# Patient Record
Sex: Female | Born: 1945
Health system: Southern US, Community
[De-identification: ages and names within clinical notes are randomized; demographics above are authoritative.]

## PROBLEM LIST (undated history)

## (undated) DIAGNOSIS — E7800 Pure hypercholesterolemia, unspecified: Secondary | ICD-10-CM

## (undated) DIAGNOSIS — E119 Type 2 diabetes mellitus without complications: Secondary | ICD-10-CM

## (undated) DIAGNOSIS — N200 Calculus of kidney: Secondary | ICD-10-CM

## (undated) DIAGNOSIS — I10 Essential (primary) hypertension: Secondary | ICD-10-CM

## (undated) HISTORY — DX: Pure hypercholesterolemia, unspecified: E78.00

## (undated) HISTORY — DX: Essential (primary) hypertension: I10

## (undated) HISTORY — DX: Calculus of kidney: N20.0

## (undated) HISTORY — DX: Type 2 diabetes mellitus without complications: E11.9

## (undated) HISTORY — PX: TUBAL LIGATION: SHX77

## (undated) HISTORY — PX: BACK SURGERY: SHX140

## (undated) HISTORY — PX: NASAL SINUS SURGERY: SHX719

---

## 1999-08-15 ENCOUNTER — Encounter: Admission: RE | Admit: 1999-08-15 | Discharge: 1999-08-15 | Payer: Self-pay | Admitting: Obstetrics and Gynecology

## 1999-08-15 ENCOUNTER — Encounter: Payer: Self-pay | Admitting: Obstetrics and Gynecology

## 2007-03-27 ENCOUNTER — Encounter: Payer: Self-pay | Admitting: Internal Medicine

## 2007-06-17 ENCOUNTER — Encounter: Payer: Self-pay | Admitting: Internal Medicine

## 2007-06-17 ENCOUNTER — Ambulatory Visit: Payer: Self-pay | Admitting: Internal Medicine

## 2007-06-17 DIAGNOSIS — H538 Other visual disturbances: Secondary | ICD-10-CM | POA: Insufficient documentation

## 2007-06-17 DIAGNOSIS — M542 Cervicalgia: Secondary | ICD-10-CM | POA: Insufficient documentation

## 2007-06-17 DIAGNOSIS — K219 Gastro-esophageal reflux disease without esophagitis: Secondary | ICD-10-CM | POA: Insufficient documentation

## 2007-06-17 DIAGNOSIS — R1013 Epigastric pain: Secondary | ICD-10-CM

## 2007-06-17 DIAGNOSIS — M25519 Pain in unspecified shoulder: Secondary | ICD-10-CM | POA: Insufficient documentation

## 2007-06-17 DIAGNOSIS — K3189 Other diseases of stomach and duodenum: Secondary | ICD-10-CM | POA: Insufficient documentation

## 2007-06-17 DIAGNOSIS — E785 Hyperlipidemia, unspecified: Secondary | ICD-10-CM | POA: Insufficient documentation

## 2007-06-17 DIAGNOSIS — M961 Postlaminectomy syndrome, not elsewhere classified: Secondary | ICD-10-CM | POA: Insufficient documentation

## 2007-06-17 DIAGNOSIS — R071 Chest pain on breathing: Secondary | ICD-10-CM | POA: Insufficient documentation

## 2007-06-17 DIAGNOSIS — I10 Essential (primary) hypertension: Secondary | ICD-10-CM | POA: Insufficient documentation

## 2007-06-17 DIAGNOSIS — IMO0002 Reserved for concepts with insufficient information to code with codable children: Secondary | ICD-10-CM | POA: Insufficient documentation

## 2007-06-17 LAB — CONVERTED CEMR LAB
ALT: 34 units/L (ref 0–35)
Alkaline Phosphatase: 69 units/L (ref 39–117)
CO2: 25 meq/L (ref 19–32)
Creatinine, Ser: 0.74 mg/dL (ref 0.40–1.20)
Sodium: 142 meq/L (ref 135–145)
TSH: 2.368 microintl units/mL (ref 0.350–5.50)
Total Bilirubin: 0.4 mg/dL (ref 0.3–1.2)
Total Protein: 7.8 g/dL (ref 6.0–8.3)

## 2007-06-19 DIAGNOSIS — R0989 Other specified symptoms and signs involving the circulatory and respiratory systems: Secondary | ICD-10-CM | POA: Insufficient documentation

## 2007-06-19 DIAGNOSIS — I428 Other cardiomyopathies: Secondary | ICD-10-CM | POA: Insufficient documentation

## 2007-06-19 DIAGNOSIS — M899 Disorder of bone, unspecified: Secondary | ICD-10-CM | POA: Insufficient documentation

## 2007-06-19 DIAGNOSIS — M949 Disorder of cartilage, unspecified: Secondary | ICD-10-CM

## 2007-06-19 DIAGNOSIS — R0609 Other forms of dyspnea: Secondary | ICD-10-CM | POA: Insufficient documentation

## 2007-10-21 ENCOUNTER — Telehealth: Payer: Self-pay | Admitting: *Deleted

## 2007-11-25 ENCOUNTER — Ambulatory Visit: Payer: Self-pay | Admitting: *Deleted

## 2007-11-25 ENCOUNTER — Encounter: Payer: Self-pay | Admitting: Internal Medicine

## 2008-02-24 ENCOUNTER — Telehealth: Payer: Self-pay | Admitting: Internal Medicine

## 2008-04-09 ENCOUNTER — Ambulatory Visit (HOSPITAL_COMMUNITY): Admission: RE | Admit: 2008-04-09 | Discharge: 2008-04-09 | Payer: Self-pay | Admitting: Unknown Physician Specialty

## 2008-05-20 ENCOUNTER — Encounter: Admission: RE | Admit: 2008-05-20 | Discharge: 2008-05-20 | Payer: Self-pay | Admitting: Unknown Physician Specialty

## 2008-09-23 ENCOUNTER — Telehealth (INDEPENDENT_AMBULATORY_CARE_PROVIDER_SITE_OTHER): Payer: Self-pay | Admitting: *Deleted

## 2009-01-18 ENCOUNTER — Encounter: Admission: RE | Admit: 2009-01-18 | Discharge: 2009-01-18 | Payer: Self-pay | Admitting: Unknown Physician Specialty

## 2009-01-18 ENCOUNTER — Encounter: Payer: Self-pay | Admitting: Internal Medicine

## 2009-05-27 ENCOUNTER — Ambulatory Visit (HOSPITAL_COMMUNITY): Admission: RE | Admit: 2009-05-27 | Discharge: 2009-05-27 | Payer: Self-pay | Admitting: Unknown Physician Specialty

## 2011-05-26 ENCOUNTER — Other Ambulatory Visit (HOSPITAL_COMMUNITY): Payer: Self-pay | Admitting: Unknown Physician Specialty

## 2011-05-26 DIAGNOSIS — Z1231 Encounter for screening mammogram for malignant neoplasm of breast: Secondary | ICD-10-CM

## 2011-06-20 ENCOUNTER — Ambulatory Visit (HOSPITAL_COMMUNITY)
Admission: RE | Admit: 2011-06-20 | Discharge: 2011-06-20 | Disposition: A | Discharge status: Home / Self Care | Payer: Self-pay | Source: Ambulatory Visit | Attending: Unknown Physician Specialty | Admitting: Unknown Physician Specialty

## 2011-06-20 DIAGNOSIS — Z1231 Encounter for screening mammogram for malignant neoplasm of breast: Secondary | ICD-10-CM

## 2012-12-05 ENCOUNTER — Other Ambulatory Visit (HOSPITAL_COMMUNITY): Payer: Self-pay | Admitting: Unknown Physician Specialty

## 2012-12-05 DIAGNOSIS — Z1231 Encounter for screening mammogram for malignant neoplasm of breast: Secondary | ICD-10-CM

## 2012-12-19 ENCOUNTER — Ambulatory Visit (HOSPITAL_COMMUNITY)
Admission: RE | Admit: 2012-12-19 | Discharge: 2012-12-19 | Disposition: A | Discharge status: Home / Self Care | Payer: Medicare Other | Source: Ambulatory Visit | Attending: Unknown Physician Specialty | Admitting: Unknown Physician Specialty

## 2012-12-19 DIAGNOSIS — Z1231 Encounter for screening mammogram for malignant neoplasm of breast: Secondary | ICD-10-CM

## 2014-01-22 DIAGNOSIS — M25511 Pain in right shoulder: Secondary | ICD-10-CM | POA: Diagnosis not present

## 2014-01-22 DIAGNOSIS — M25641 Stiffness of right hand, not elsewhere classified: Secondary | ICD-10-CM | POA: Diagnosis not present

## 2014-02-18 DIAGNOSIS — E780 Pure hypercholesterolemia: Secondary | ICD-10-CM | POA: Diagnosis not present

## 2014-02-18 DIAGNOSIS — R5383 Other fatigue: Secondary | ICD-10-CM | POA: Diagnosis not present

## 2014-02-18 DIAGNOSIS — M25511 Pain in right shoulder: Secondary | ICD-10-CM | POA: Diagnosis not present

## 2014-02-18 DIAGNOSIS — E559 Vitamin D deficiency, unspecified: Secondary | ICD-10-CM | POA: Diagnosis not present

## 2014-02-18 DIAGNOSIS — Z01419 Encounter for gynecological examination (general) (routine) without abnormal findings: Secondary | ICD-10-CM | POA: Diagnosis not present

## 2014-02-18 DIAGNOSIS — Z1239 Encounter for other screening for malignant neoplasm of breast: Secondary | ICD-10-CM | POA: Diagnosis not present

## 2014-02-18 DIAGNOSIS — M8580 Other specified disorders of bone density and structure, unspecified site: Secondary | ICD-10-CM | POA: Diagnosis not present

## 2014-02-18 DIAGNOSIS — E119 Type 2 diabetes mellitus without complications: Secondary | ICD-10-CM | POA: Diagnosis not present

## 2014-02-18 DIAGNOSIS — I10 Essential (primary) hypertension: Secondary | ICD-10-CM | POA: Diagnosis not present

## 2014-02-18 DIAGNOSIS — R634 Abnormal weight loss: Secondary | ICD-10-CM | POA: Diagnosis not present

## 2014-02-18 DIAGNOSIS — Z9189 Other specified personal risk factors, not elsewhere classified: Secondary | ICD-10-CM | POA: Diagnosis not present

## 2014-04-01 DIAGNOSIS — S42209A Unspecified fracture of upper end of unspecified humerus, initial encounter for closed fracture: Secondary | ICD-10-CM | POA: Diagnosis not present

## 2014-04-01 DIAGNOSIS — Z9889 Other specified postprocedural states: Secondary | ICD-10-CM | POA: Diagnosis not present

## 2014-04-01 DIAGNOSIS — G5620 Lesion of ulnar nerve, unspecified upper limb: Secondary | ICD-10-CM | POA: Diagnosis not present

## 2014-06-03 DIAGNOSIS — Z9889 Other specified postprocedural states: Secondary | ICD-10-CM | POA: Diagnosis not present

## 2014-06-03 DIAGNOSIS — M25511 Pain in right shoulder: Secondary | ICD-10-CM | POA: Diagnosis not present

## 2014-07-10 DIAGNOSIS — I10 Essential (primary) hypertension: Secondary | ICD-10-CM | POA: Diagnosis not present

## 2014-07-10 DIAGNOSIS — F4321 Adjustment disorder with depressed mood: Secondary | ICD-10-CM | POA: Diagnosis not present

## 2014-07-10 DIAGNOSIS — E119 Type 2 diabetes mellitus without complications: Secondary | ICD-10-CM | POA: Diagnosis not present

## 2014-07-10 DIAGNOSIS — E780 Pure hypercholesterolemia: Secondary | ICD-10-CM | POA: Diagnosis not present

## 2014-07-23 DIAGNOSIS — Z139 Encounter for screening, unspecified: Secondary | ICD-10-CM | POA: Diagnosis not present

## 2014-07-23 DIAGNOSIS — M810 Age-related osteoporosis without current pathological fracture: Secondary | ICD-10-CM | POA: Diagnosis not present

## 2014-08-28 DIAGNOSIS — S42209A Unspecified fracture of upper end of unspecified humerus, initial encounter for closed fracture: Secondary | ICD-10-CM | POA: Diagnosis not present

## 2014-08-28 DIAGNOSIS — M25511 Pain in right shoulder: Secondary | ICD-10-CM | POA: Diagnosis not present

## 2014-10-05 DIAGNOSIS — H04121 Dry eye syndrome of right lacrimal gland: Secondary | ICD-10-CM | POA: Diagnosis not present

## 2014-10-05 DIAGNOSIS — E119 Type 2 diabetes mellitus without complications: Secondary | ICD-10-CM | POA: Diagnosis not present

## 2014-10-28 DIAGNOSIS — R05 Cough: Secondary | ICD-10-CM | POA: Diagnosis not present

## 2014-10-28 DIAGNOSIS — M25511 Pain in right shoulder: Secondary | ICD-10-CM | POA: Diagnosis not present

## 2014-10-28 DIAGNOSIS — H903 Sensorineural hearing loss, bilateral: Secondary | ICD-10-CM | POA: Diagnosis not present

## 2014-10-28 DIAGNOSIS — H9202 Otalgia, left ear: Secondary | ICD-10-CM | POA: Diagnosis not present

## 2014-11-06 DIAGNOSIS — E7800 Pure hypercholesterolemia, unspecified: Secondary | ICD-10-CM | POA: Diagnosis not present

## 2014-11-06 DIAGNOSIS — E119 Type 2 diabetes mellitus without complications: Secondary | ICD-10-CM | POA: Diagnosis not present

## 2014-11-06 DIAGNOSIS — I10 Essential (primary) hypertension: Secondary | ICD-10-CM | POA: Diagnosis not present

## 2014-11-06 DIAGNOSIS — Z23 Encounter for immunization: Secondary | ICD-10-CM | POA: Diagnosis not present

## 2014-11-06 DIAGNOSIS — L219 Seborrheic dermatitis, unspecified: Secondary | ICD-10-CM | POA: Diagnosis not present

## 2014-12-03 DIAGNOSIS — R490 Dysphonia: Secondary | ICD-10-CM | POA: Diagnosis not present

## 2014-12-03 DIAGNOSIS — R05 Cough: Secondary | ICD-10-CM | POA: Diagnosis not present

## 2014-12-03 DIAGNOSIS — J387 Other diseases of larynx: Secondary | ICD-10-CM | POA: Diagnosis not present

## 2014-12-04 DIAGNOSIS — R490 Dysphonia: Secondary | ICD-10-CM | POA: Diagnosis not present

## 2014-12-04 DIAGNOSIS — J387 Other diseases of larynx: Secondary | ICD-10-CM | POA: Diagnosis not present

## 2014-12-04 DIAGNOSIS — R05 Cough: Secondary | ICD-10-CM | POA: Diagnosis not present

## 2014-12-18 DIAGNOSIS — Z0100 Encounter for examination of eyes and vision without abnormal findings: Secondary | ICD-10-CM | POA: Diagnosis not present

## 2014-12-18 DIAGNOSIS — E119 Type 2 diabetes mellitus without complications: Secondary | ICD-10-CM | POA: Diagnosis not present

## 2014-12-18 DIAGNOSIS — H5210 Myopia, unspecified eye: Secondary | ICD-10-CM | POA: Diagnosis not present

## 2014-12-18 DIAGNOSIS — H524 Presbyopia: Secondary | ICD-10-CM | POA: Diagnosis not present

## 2015-01-27 DIAGNOSIS — M25511 Pain in right shoulder: Secondary | ICD-10-CM | POA: Diagnosis not present

## 2015-01-27 DIAGNOSIS — S42209A Unspecified fracture of upper end of unspecified humerus, initial encounter for closed fracture: Secondary | ICD-10-CM | POA: Diagnosis not present

## 2015-01-27 DIAGNOSIS — G5620 Lesion of ulnar nerve, unspecified upper limb: Secondary | ICD-10-CM | POA: Diagnosis not present

## 2015-02-04 DIAGNOSIS — M25511 Pain in right shoulder: Secondary | ICD-10-CM | POA: Diagnosis not present

## 2015-03-01 DIAGNOSIS — I10 Essential (primary) hypertension: Secondary | ICD-10-CM | POA: Diagnosis not present

## 2015-03-01 DIAGNOSIS — E119 Type 2 diabetes mellitus without complications: Secondary | ICD-10-CM | POA: Diagnosis not present

## 2015-03-01 DIAGNOSIS — Z23 Encounter for immunization: Secondary | ICD-10-CM | POA: Diagnosis not present

## 2015-03-01 DIAGNOSIS — M810 Age-related osteoporosis without current pathological fracture: Secondary | ICD-10-CM | POA: Diagnosis not present

## 2015-03-01 DIAGNOSIS — Z0000 Encounter for general adult medical examination without abnormal findings: Secondary | ICD-10-CM | POA: Diagnosis not present

## 2015-03-01 DIAGNOSIS — M899 Disorder of bone, unspecified: Secondary | ICD-10-CM | POA: Diagnosis not present

## 2015-03-01 DIAGNOSIS — Z1289 Encounter for screening for malignant neoplasm of other sites: Secondary | ICD-10-CM | POA: Diagnosis not present

## 2015-03-01 DIAGNOSIS — E7800 Pure hypercholesterolemia, unspecified: Secondary | ICD-10-CM | POA: Diagnosis not present

## 2015-03-22 ENCOUNTER — Other Ambulatory Visit: Payer: Self-pay | Admitting: Family Medicine

## 2015-03-22 ENCOUNTER — Ambulatory Visit (INDEPENDENT_AMBULATORY_CARE_PROVIDER_SITE_OTHER): Payer: Commercial Managed Care - HMO

## 2015-03-22 DIAGNOSIS — S6991XA Unspecified injury of right wrist, hand and finger(s), initial encounter: Secondary | ICD-10-CM | POA: Diagnosis not present

## 2015-03-22 DIAGNOSIS — X58XXXA Exposure to other specified factors, initial encounter: Secondary | ICD-10-CM | POA: Diagnosis not present

## 2015-03-22 DIAGNOSIS — S62616A Displaced fracture of proximal phalanx of right little finger, initial encounter for closed fracture: Secondary | ICD-10-CM

## 2015-03-22 DIAGNOSIS — W19XXXA Unspecified fall, initial encounter: Secondary | ICD-10-CM | POA: Diagnosis not present

## 2015-03-22 DIAGNOSIS — S62646A Nondisplaced fracture of proximal phalanx of right little finger, initial encounter for closed fracture: Secondary | ICD-10-CM | POA: Diagnosis not present

## 2015-03-22 DIAGNOSIS — S0101XA Laceration without foreign body of scalp, initial encounter: Secondary | ICD-10-CM | POA: Diagnosis not present

## 2015-03-22 DIAGNOSIS — R011 Cardiac murmur, unspecified: Secondary | ICD-10-CM | POA: Diagnosis not present

## 2015-03-22 DIAGNOSIS — R0602 Shortness of breath: Secondary | ICD-10-CM | POA: Diagnosis not present

## 2015-03-24 ENCOUNTER — Other Ambulatory Visit: Payer: Self-pay | Admitting: Unknown Physician Specialty

## 2015-03-24 DIAGNOSIS — Z1231 Encounter for screening mammogram for malignant neoplasm of breast: Secondary | ICD-10-CM

## 2015-03-25 DIAGNOSIS — M79641 Pain in right hand: Secondary | ICD-10-CM | POA: Diagnosis not present

## 2015-03-25 DIAGNOSIS — S62619A Displaced fracture of proximal phalanx of unspecified finger, initial encounter for closed fracture: Secondary | ICD-10-CM | POA: Diagnosis not present

## 2015-03-31 ENCOUNTER — Ambulatory Visit (INDEPENDENT_AMBULATORY_CARE_PROVIDER_SITE_OTHER): Payer: Commercial Managed Care - HMO

## 2015-03-31 DIAGNOSIS — Z1231 Encounter for screening mammogram for malignant neoplasm of breast: Secondary | ICD-10-CM

## 2015-04-07 DIAGNOSIS — M25511 Pain in right shoulder: Secondary | ICD-10-CM | POA: Diagnosis not present

## 2015-04-13 DIAGNOSIS — M6281 Muscle weakness (generalized): Secondary | ICD-10-CM | POA: Diagnosis not present

## 2015-04-13 DIAGNOSIS — M25611 Stiffness of right shoulder, not elsewhere classified: Secondary | ICD-10-CM | POA: Diagnosis not present

## 2015-04-13 DIAGNOSIS — M25511 Pain in right shoulder: Secondary | ICD-10-CM | POA: Diagnosis not present

## 2015-04-15 DIAGNOSIS — M25511 Pain in right shoulder: Secondary | ICD-10-CM | POA: Diagnosis not present

## 2015-04-15 DIAGNOSIS — M6281 Muscle weakness (generalized): Secondary | ICD-10-CM | POA: Diagnosis not present

## 2015-04-15 DIAGNOSIS — M25611 Stiffness of right shoulder, not elsewhere classified: Secondary | ICD-10-CM | POA: Diagnosis not present

## 2015-04-20 DIAGNOSIS — M6281 Muscle weakness (generalized): Secondary | ICD-10-CM | POA: Diagnosis not present

## 2015-04-20 DIAGNOSIS — M25611 Stiffness of right shoulder, not elsewhere classified: Secondary | ICD-10-CM | POA: Diagnosis not present

## 2015-04-20 DIAGNOSIS — M25511 Pain in right shoulder: Secondary | ICD-10-CM | POA: Diagnosis not present

## 2015-04-26 DIAGNOSIS — M6281 Muscle weakness (generalized): Secondary | ICD-10-CM | POA: Diagnosis not present

## 2015-04-26 DIAGNOSIS — M25511 Pain in right shoulder: Secondary | ICD-10-CM | POA: Diagnosis not present

## 2015-04-26 DIAGNOSIS — M25611 Stiffness of right shoulder, not elsewhere classified: Secondary | ICD-10-CM | POA: Diagnosis not present

## 2015-04-30 DIAGNOSIS — M6281 Muscle weakness (generalized): Secondary | ICD-10-CM | POA: Diagnosis not present

## 2015-04-30 DIAGNOSIS — M25511 Pain in right shoulder: Secondary | ICD-10-CM | POA: Diagnosis not present

## 2015-04-30 DIAGNOSIS — M25611 Stiffness of right shoulder, not elsewhere classified: Secondary | ICD-10-CM | POA: Diagnosis not present

## 2015-05-03 DIAGNOSIS — M6281 Muscle weakness (generalized): Secondary | ICD-10-CM | POA: Diagnosis not present

## 2015-05-03 DIAGNOSIS — M25511 Pain in right shoulder: Secondary | ICD-10-CM | POA: Diagnosis not present

## 2015-05-03 DIAGNOSIS — M25611 Stiffness of right shoulder, not elsewhere classified: Secondary | ICD-10-CM | POA: Diagnosis not present

## 2015-05-07 DIAGNOSIS — M25511 Pain in right shoulder: Secondary | ICD-10-CM | POA: Diagnosis not present

## 2015-05-07 DIAGNOSIS — M6281 Muscle weakness (generalized): Secondary | ICD-10-CM | POA: Diagnosis not present

## 2015-05-07 DIAGNOSIS — M25611 Stiffness of right shoulder, not elsewhere classified: Secondary | ICD-10-CM | POA: Diagnosis not present

## 2015-05-12 DIAGNOSIS — M6281 Muscle weakness (generalized): Secondary | ICD-10-CM | POA: Diagnosis not present

## 2015-05-12 DIAGNOSIS — M25511 Pain in right shoulder: Secondary | ICD-10-CM | POA: Diagnosis not present

## 2015-05-12 DIAGNOSIS — M25611 Stiffness of right shoulder, not elsewhere classified: Secondary | ICD-10-CM | POA: Diagnosis not present

## 2015-05-14 DIAGNOSIS — M25611 Stiffness of right shoulder, not elsewhere classified: Secondary | ICD-10-CM | POA: Diagnosis not present

## 2015-05-14 DIAGNOSIS — M25511 Pain in right shoulder: Secondary | ICD-10-CM | POA: Diagnosis not present

## 2015-05-14 DIAGNOSIS — M6281 Muscle weakness (generalized): Secondary | ICD-10-CM | POA: Diagnosis not present

## 2015-05-19 DIAGNOSIS — M25611 Stiffness of right shoulder, not elsewhere classified: Secondary | ICD-10-CM | POA: Diagnosis not present

## 2015-05-19 DIAGNOSIS — M25511 Pain in right shoulder: Secondary | ICD-10-CM | POA: Diagnosis not present

## 2015-05-19 DIAGNOSIS — M6281 Muscle weakness (generalized): Secondary | ICD-10-CM | POA: Diagnosis not present

## 2015-05-21 DIAGNOSIS — M25511 Pain in right shoulder: Secondary | ICD-10-CM | POA: Diagnosis not present

## 2015-05-21 DIAGNOSIS — M25611 Stiffness of right shoulder, not elsewhere classified: Secondary | ICD-10-CM | POA: Diagnosis not present

## 2015-05-21 DIAGNOSIS — M6281 Muscle weakness (generalized): Secondary | ICD-10-CM | POA: Diagnosis not present

## 2015-05-26 DIAGNOSIS — M6281 Muscle weakness (generalized): Secondary | ICD-10-CM | POA: Diagnosis not present

## 2015-05-26 DIAGNOSIS — M25611 Stiffness of right shoulder, not elsewhere classified: Secondary | ICD-10-CM | POA: Diagnosis not present

## 2015-05-26 DIAGNOSIS — M7501 Adhesive capsulitis of right shoulder: Secondary | ICD-10-CM | POA: Diagnosis not present

## 2015-05-26 DIAGNOSIS — M79641 Pain in right hand: Secondary | ICD-10-CM | POA: Diagnosis not present

## 2015-05-26 DIAGNOSIS — M25511 Pain in right shoulder: Secondary | ICD-10-CM | POA: Diagnosis not present

## 2015-05-28 DIAGNOSIS — M6281 Muscle weakness (generalized): Secondary | ICD-10-CM | POA: Diagnosis not present

## 2015-05-28 DIAGNOSIS — M25511 Pain in right shoulder: Secondary | ICD-10-CM | POA: Diagnosis not present

## 2015-05-28 DIAGNOSIS — M25611 Stiffness of right shoulder, not elsewhere classified: Secondary | ICD-10-CM | POA: Diagnosis not present

## 2015-05-31 DIAGNOSIS — M25511 Pain in right shoulder: Secondary | ICD-10-CM | POA: Diagnosis not present

## 2015-05-31 DIAGNOSIS — M25611 Stiffness of right shoulder, not elsewhere classified: Secondary | ICD-10-CM | POA: Diagnosis not present

## 2015-05-31 DIAGNOSIS — M6281 Muscle weakness (generalized): Secondary | ICD-10-CM | POA: Diagnosis not present

## 2015-06-09 DIAGNOSIS — M6281 Muscle weakness (generalized): Secondary | ICD-10-CM | POA: Diagnosis not present

## 2015-06-09 DIAGNOSIS — M25511 Pain in right shoulder: Secondary | ICD-10-CM | POA: Diagnosis not present

## 2015-06-09 DIAGNOSIS — M25611 Stiffness of right shoulder, not elsewhere classified: Secondary | ICD-10-CM | POA: Diagnosis not present

## 2015-06-11 DIAGNOSIS — M6281 Muscle weakness (generalized): Secondary | ICD-10-CM | POA: Diagnosis not present

## 2015-06-11 DIAGNOSIS — M25511 Pain in right shoulder: Secondary | ICD-10-CM | POA: Diagnosis not present

## 2015-06-11 DIAGNOSIS — M25611 Stiffness of right shoulder, not elsewhere classified: Secondary | ICD-10-CM | POA: Diagnosis not present

## 2015-06-16 DIAGNOSIS — M6281 Muscle weakness (generalized): Secondary | ICD-10-CM | POA: Diagnosis not present

## 2015-06-16 DIAGNOSIS — M25511 Pain in right shoulder: Secondary | ICD-10-CM | POA: Diagnosis not present

## 2015-06-16 DIAGNOSIS — M25611 Stiffness of right shoulder, not elsewhere classified: Secondary | ICD-10-CM | POA: Diagnosis not present

## 2015-06-18 DIAGNOSIS — M25511 Pain in right shoulder: Secondary | ICD-10-CM | POA: Diagnosis not present

## 2015-06-18 DIAGNOSIS — M25611 Stiffness of right shoulder, not elsewhere classified: Secondary | ICD-10-CM | POA: Diagnosis not present

## 2015-06-18 DIAGNOSIS — M6281 Muscle weakness (generalized): Secondary | ICD-10-CM | POA: Diagnosis not present

## 2015-06-23 DIAGNOSIS — M25611 Stiffness of right shoulder, not elsewhere classified: Secondary | ICD-10-CM | POA: Diagnosis not present

## 2015-06-23 DIAGNOSIS — M6281 Muscle weakness (generalized): Secondary | ICD-10-CM | POA: Diagnosis not present

## 2015-06-23 DIAGNOSIS — M25511 Pain in right shoulder: Secondary | ICD-10-CM | POA: Diagnosis not present

## 2015-06-25 DIAGNOSIS — M25511 Pain in right shoulder: Secondary | ICD-10-CM | POA: Diagnosis not present

## 2015-06-25 DIAGNOSIS — M6281 Muscle weakness (generalized): Secondary | ICD-10-CM | POA: Diagnosis not present

## 2015-06-25 DIAGNOSIS — M25611 Stiffness of right shoulder, not elsewhere classified: Secondary | ICD-10-CM | POA: Diagnosis not present

## 2015-06-30 DIAGNOSIS — M6281 Muscle weakness (generalized): Secondary | ICD-10-CM | POA: Diagnosis not present

## 2015-06-30 DIAGNOSIS — M25611 Stiffness of right shoulder, not elsewhere classified: Secondary | ICD-10-CM | POA: Diagnosis not present

## 2015-06-30 DIAGNOSIS — M25511 Pain in right shoulder: Secondary | ICD-10-CM | POA: Diagnosis not present

## 2015-07-02 DIAGNOSIS — M6281 Muscle weakness (generalized): Secondary | ICD-10-CM | POA: Diagnosis not present

## 2015-07-02 DIAGNOSIS — M25511 Pain in right shoulder: Secondary | ICD-10-CM | POA: Diagnosis not present

## 2015-07-02 DIAGNOSIS — M25611 Stiffness of right shoulder, not elsewhere classified: Secondary | ICD-10-CM | POA: Diagnosis not present

## 2015-07-07 DIAGNOSIS — M6281 Muscle weakness (generalized): Secondary | ICD-10-CM | POA: Diagnosis not present

## 2015-07-07 DIAGNOSIS — M25511 Pain in right shoulder: Secondary | ICD-10-CM | POA: Diagnosis not present

## 2015-07-07 DIAGNOSIS — M25611 Stiffness of right shoulder, not elsewhere classified: Secondary | ICD-10-CM | POA: Diagnosis not present

## 2015-07-09 DIAGNOSIS — B373 Candidiasis of vulva and vagina: Secondary | ICD-10-CM | POA: Diagnosis not present

## 2015-07-09 DIAGNOSIS — E7800 Pure hypercholesterolemia, unspecified: Secondary | ICD-10-CM | POA: Diagnosis not present

## 2015-07-09 DIAGNOSIS — M810 Age-related osteoporosis without current pathological fracture: Secondary | ICD-10-CM | POA: Diagnosis not present

## 2015-07-09 DIAGNOSIS — I10 Essential (primary) hypertension: Secondary | ICD-10-CM | POA: Diagnosis not present

## 2015-07-09 DIAGNOSIS — E119 Type 2 diabetes mellitus without complications: Secondary | ICD-10-CM | POA: Diagnosis not present

## 2015-07-16 DIAGNOSIS — M6281 Muscle weakness (generalized): Secondary | ICD-10-CM | POA: Diagnosis not present

## 2015-07-16 DIAGNOSIS — M25611 Stiffness of right shoulder, not elsewhere classified: Secondary | ICD-10-CM | POA: Diagnosis not present

## 2015-07-16 DIAGNOSIS — M25511 Pain in right shoulder: Secondary | ICD-10-CM | POA: Diagnosis not present

## 2015-07-26 DIAGNOSIS — M25511 Pain in right shoulder: Secondary | ICD-10-CM | POA: Diagnosis not present

## 2015-07-26 DIAGNOSIS — M25611 Stiffness of right shoulder, not elsewhere classified: Secondary | ICD-10-CM | POA: Diagnosis not present

## 2015-07-26 DIAGNOSIS — M6281 Muscle weakness (generalized): Secondary | ICD-10-CM | POA: Diagnosis not present

## 2015-08-09 DIAGNOSIS — M25611 Stiffness of right shoulder, not elsewhere classified: Secondary | ICD-10-CM | POA: Diagnosis not present

## 2015-08-09 DIAGNOSIS — M25511 Pain in right shoulder: Secondary | ICD-10-CM | POA: Diagnosis not present

## 2015-08-09 DIAGNOSIS — M6281 Muscle weakness (generalized): Secondary | ICD-10-CM | POA: Diagnosis not present

## 2015-08-11 DIAGNOSIS — M7501 Adhesive capsulitis of right shoulder: Secondary | ICD-10-CM | POA: Diagnosis not present

## 2015-08-11 DIAGNOSIS — M25511 Pain in right shoulder: Secondary | ICD-10-CM | POA: Diagnosis not present

## 2015-08-16 DIAGNOSIS — L298 Other pruritus: Secondary | ICD-10-CM | POA: Diagnosis not present

## 2015-08-16 DIAGNOSIS — R828 Abnormal findings on cytological and histological examination of urine: Secondary | ICD-10-CM | POA: Diagnosis not present

## 2015-08-16 DIAGNOSIS — N760 Acute vaginitis: Secondary | ICD-10-CM | POA: Diagnosis not present

## 2015-08-16 DIAGNOSIS — N949 Unspecified condition associated with female genital organs and menstrual cycle: Secondary | ICD-10-CM | POA: Diagnosis not present

## 2015-08-16 DIAGNOSIS — R300 Dysuria: Secondary | ICD-10-CM | POA: Diagnosis not present

## 2015-08-20 DIAGNOSIS — Z113 Encounter for screening for infections with a predominantly sexual mode of transmission: Secondary | ICD-10-CM | POA: Diagnosis not present

## 2015-08-23 DIAGNOSIS — M25511 Pain in right shoulder: Secondary | ICD-10-CM | POA: Diagnosis not present

## 2015-08-25 DIAGNOSIS — M25511 Pain in right shoulder: Secondary | ICD-10-CM | POA: Diagnosis not present

## 2015-08-25 DIAGNOSIS — M79641 Pain in right hand: Secondary | ICD-10-CM | POA: Diagnosis not present

## 2015-08-25 DIAGNOSIS — M19019 Primary osteoarthritis, unspecified shoulder: Secondary | ICD-10-CM | POA: Diagnosis not present

## 2015-08-31 DIAGNOSIS — Z01818 Encounter for other preprocedural examination: Secondary | ICD-10-CM | POA: Diagnosis not present

## 2015-08-31 DIAGNOSIS — E119 Type 2 diabetes mellitus without complications: Secondary | ICD-10-CM | POA: Diagnosis not present

## 2015-08-31 DIAGNOSIS — M25511 Pain in right shoulder: Secondary | ICD-10-CM | POA: Diagnosis not present

## 2015-08-31 DIAGNOSIS — Z7984 Long term (current) use of oral hypoglycemic drugs: Secondary | ICD-10-CM | POA: Diagnosis not present

## 2015-08-31 DIAGNOSIS — Z79899 Other long term (current) drug therapy: Secondary | ICD-10-CM | POA: Diagnosis not present

## 2015-08-31 DIAGNOSIS — R011 Cardiac murmur, unspecified: Secondary | ICD-10-CM | POA: Diagnosis not present

## 2015-09-07 DIAGNOSIS — M19111 Post-traumatic osteoarthritis, right shoulder: Secondary | ICD-10-CM | POA: Diagnosis not present

## 2015-09-07 DIAGNOSIS — I10 Essential (primary) hypertension: Secondary | ICD-10-CM | POA: Diagnosis not present

## 2015-09-07 DIAGNOSIS — E119 Type 2 diabetes mellitus without complications: Secondary | ICD-10-CM | POA: Diagnosis not present

## 2015-09-07 DIAGNOSIS — K219 Gastro-esophageal reflux disease without esophagitis: Secondary | ICD-10-CM | POA: Diagnosis not present

## 2015-09-07 DIAGNOSIS — S46011A Strain of muscle(s) and tendon(s) of the rotator cuff of right shoulder, initial encounter: Secondary | ICD-10-CM | POA: Diagnosis not present

## 2015-09-07 DIAGNOSIS — T8484XA Pain due to internal orthopedic prosthetic devices, implants and grafts, initial encounter: Secondary | ICD-10-CM | POA: Diagnosis not present

## 2015-09-07 DIAGNOSIS — Z471 Aftercare following joint replacement surgery: Secondary | ICD-10-CM | POA: Diagnosis not present

## 2015-09-07 DIAGNOSIS — M8438XA Stress fracture, other site, initial encounter for fracture: Secondary | ICD-10-CM | POA: Diagnosis not present

## 2015-09-07 DIAGNOSIS — M87211 Osteonecrosis due to previous trauma, right shoulder: Secondary | ICD-10-CM | POA: Diagnosis not present

## 2015-09-07 DIAGNOSIS — M87821 Other osteonecrosis, right humerus: Secondary | ICD-10-CM | POA: Diagnosis not present

## 2015-09-07 DIAGNOSIS — G8918 Other acute postprocedural pain: Secondary | ICD-10-CM | POA: Diagnosis not present

## 2015-09-07 DIAGNOSIS — M12811 Other specific arthropathies, not elsewhere classified, right shoulder: Secondary | ICD-10-CM | POA: Diagnosis not present

## 2015-09-07 DIAGNOSIS — Z96611 Presence of right artificial shoulder joint: Secondary | ICD-10-CM | POA: Diagnosis not present

## 2015-09-07 DIAGNOSIS — E7800 Pure hypercholesterolemia, unspecified: Secondary | ICD-10-CM | POA: Diagnosis not present

## 2015-09-22 DIAGNOSIS — M25511 Pain in right shoulder: Secondary | ICD-10-CM | POA: Diagnosis not present

## 2015-10-26 DIAGNOSIS — M25611 Stiffness of right shoulder, not elsewhere classified: Secondary | ICD-10-CM | POA: Diagnosis not present

## 2015-10-26 DIAGNOSIS — M25511 Pain in right shoulder: Secondary | ICD-10-CM | POA: Diagnosis not present

## 2015-10-26 DIAGNOSIS — M6281 Muscle weakness (generalized): Secondary | ICD-10-CM | POA: Diagnosis not present

## 2015-11-03 DIAGNOSIS — M25511 Pain in right shoulder: Secondary | ICD-10-CM | POA: Diagnosis not present

## 2015-11-03 DIAGNOSIS — M25611 Stiffness of right shoulder, not elsewhere classified: Secondary | ICD-10-CM | POA: Diagnosis not present

## 2015-11-03 DIAGNOSIS — M6281 Muscle weakness (generalized): Secondary | ICD-10-CM | POA: Diagnosis not present

## 2015-11-05 DIAGNOSIS — M6281 Muscle weakness (generalized): Secondary | ICD-10-CM | POA: Diagnosis not present

## 2015-11-05 DIAGNOSIS — M25611 Stiffness of right shoulder, not elsewhere classified: Secondary | ICD-10-CM | POA: Diagnosis not present

## 2015-11-05 DIAGNOSIS — M25511 Pain in right shoulder: Secondary | ICD-10-CM | POA: Diagnosis not present

## 2015-11-10 DIAGNOSIS — M25611 Stiffness of right shoulder, not elsewhere classified: Secondary | ICD-10-CM | POA: Diagnosis not present

## 2015-11-10 DIAGNOSIS — E119 Type 2 diabetes mellitus without complications: Secondary | ICD-10-CM | POA: Diagnosis not present

## 2015-11-10 DIAGNOSIS — I10 Essential (primary) hypertension: Secondary | ICD-10-CM | POA: Diagnosis not present

## 2015-11-10 DIAGNOSIS — M25511 Pain in right shoulder: Secondary | ICD-10-CM | POA: Diagnosis not present

## 2015-11-10 DIAGNOSIS — Z23 Encounter for immunization: Secondary | ICD-10-CM | POA: Diagnosis not present

## 2015-11-10 DIAGNOSIS — E7800 Pure hypercholesterolemia, unspecified: Secondary | ICD-10-CM | POA: Diagnosis not present

## 2015-11-10 DIAGNOSIS — K219 Gastro-esophageal reflux disease without esophagitis: Secondary | ICD-10-CM | POA: Diagnosis not present

## 2015-11-10 DIAGNOSIS — M6281 Muscle weakness (generalized): Secondary | ICD-10-CM | POA: Diagnosis not present

## 2015-11-12 DIAGNOSIS — M25611 Stiffness of right shoulder, not elsewhere classified: Secondary | ICD-10-CM | POA: Diagnosis not present

## 2015-11-12 DIAGNOSIS — M25511 Pain in right shoulder: Secondary | ICD-10-CM | POA: Diagnosis not present

## 2015-11-12 DIAGNOSIS — M6281 Muscle weakness (generalized): Secondary | ICD-10-CM | POA: Diagnosis not present

## 2015-11-15 DIAGNOSIS — M25511 Pain in right shoulder: Secondary | ICD-10-CM | POA: Diagnosis not present

## 2015-11-15 DIAGNOSIS — M25611 Stiffness of right shoulder, not elsewhere classified: Secondary | ICD-10-CM | POA: Diagnosis not present

## 2015-11-15 DIAGNOSIS — M6281 Muscle weakness (generalized): Secondary | ICD-10-CM | POA: Diagnosis not present

## 2015-11-17 DIAGNOSIS — M25611 Stiffness of right shoulder, not elsewhere classified: Secondary | ICD-10-CM | POA: Diagnosis not present

## 2015-11-17 DIAGNOSIS — M6281 Muscle weakness (generalized): Secondary | ICD-10-CM | POA: Diagnosis not present

## 2015-11-17 DIAGNOSIS — M25511 Pain in right shoulder: Secondary | ICD-10-CM | POA: Diagnosis not present

## 2015-12-01 DIAGNOSIS — M25511 Pain in right shoulder: Secondary | ICD-10-CM | POA: Diagnosis not present

## 2015-12-01 DIAGNOSIS — M6281 Muscle weakness (generalized): Secondary | ICD-10-CM | POA: Diagnosis not present

## 2015-12-01 DIAGNOSIS — M25611 Stiffness of right shoulder, not elsewhere classified: Secondary | ICD-10-CM | POA: Diagnosis not present

## 2015-12-03 DIAGNOSIS — M25611 Stiffness of right shoulder, not elsewhere classified: Secondary | ICD-10-CM | POA: Diagnosis not present

## 2015-12-03 DIAGNOSIS — M6281 Muscle weakness (generalized): Secondary | ICD-10-CM | POA: Diagnosis not present

## 2015-12-03 DIAGNOSIS — M25511 Pain in right shoulder: Secondary | ICD-10-CM | POA: Diagnosis not present

## 2015-12-06 DIAGNOSIS — M25611 Stiffness of right shoulder, not elsewhere classified: Secondary | ICD-10-CM | POA: Diagnosis not present

## 2015-12-06 DIAGNOSIS — M25511 Pain in right shoulder: Secondary | ICD-10-CM | POA: Diagnosis not present

## 2015-12-06 DIAGNOSIS — M6281 Muscle weakness (generalized): Secondary | ICD-10-CM | POA: Diagnosis not present

## 2015-12-13 DIAGNOSIS — M25611 Stiffness of right shoulder, not elsewhere classified: Secondary | ICD-10-CM | POA: Diagnosis not present

## 2015-12-13 DIAGNOSIS — M6281 Muscle weakness (generalized): Secondary | ICD-10-CM | POA: Diagnosis not present

## 2015-12-13 DIAGNOSIS — M25511 Pain in right shoulder: Secondary | ICD-10-CM | POA: Diagnosis not present

## 2015-12-15 DIAGNOSIS — M6281 Muscle weakness (generalized): Secondary | ICD-10-CM | POA: Diagnosis not present

## 2015-12-15 DIAGNOSIS — M25611 Stiffness of right shoulder, not elsewhere classified: Secondary | ICD-10-CM | POA: Diagnosis not present

## 2015-12-15 DIAGNOSIS — M25511 Pain in right shoulder: Secondary | ICD-10-CM | POA: Diagnosis not present

## 2015-12-20 DIAGNOSIS — M25511 Pain in right shoulder: Secondary | ICD-10-CM | POA: Diagnosis not present

## 2015-12-20 DIAGNOSIS — M6281 Muscle weakness (generalized): Secondary | ICD-10-CM | POA: Diagnosis not present

## 2015-12-20 DIAGNOSIS — M25611 Stiffness of right shoulder, not elsewhere classified: Secondary | ICD-10-CM | POA: Diagnosis not present

## 2015-12-22 DIAGNOSIS — M25611 Stiffness of right shoulder, not elsewhere classified: Secondary | ICD-10-CM | POA: Diagnosis not present

## 2015-12-22 DIAGNOSIS — M6281 Muscle weakness (generalized): Secondary | ICD-10-CM | POA: Diagnosis not present

## 2015-12-22 DIAGNOSIS — M25511 Pain in right shoulder: Secondary | ICD-10-CM | POA: Diagnosis not present

## 2015-12-27 DIAGNOSIS — M25511 Pain in right shoulder: Secondary | ICD-10-CM | POA: Diagnosis not present

## 2015-12-27 DIAGNOSIS — M6281 Muscle weakness (generalized): Secondary | ICD-10-CM | POA: Diagnosis not present

## 2015-12-27 DIAGNOSIS — M25611 Stiffness of right shoulder, not elsewhere classified: Secondary | ICD-10-CM | POA: Diagnosis not present

## 2015-12-29 DIAGNOSIS — M25611 Stiffness of right shoulder, not elsewhere classified: Secondary | ICD-10-CM | POA: Diagnosis not present

## 2015-12-29 DIAGNOSIS — M25511 Pain in right shoulder: Secondary | ICD-10-CM | POA: Diagnosis not present

## 2015-12-29 DIAGNOSIS — M6281 Muscle weakness (generalized): Secondary | ICD-10-CM | POA: Diagnosis not present

## 2016-01-03 DIAGNOSIS — M6281 Muscle weakness (generalized): Secondary | ICD-10-CM | POA: Diagnosis not present

## 2016-01-03 DIAGNOSIS — M25511 Pain in right shoulder: Secondary | ICD-10-CM | POA: Diagnosis not present

## 2016-01-03 DIAGNOSIS — M25611 Stiffness of right shoulder, not elsewhere classified: Secondary | ICD-10-CM | POA: Diagnosis not present

## 2016-01-05 DIAGNOSIS — M25611 Stiffness of right shoulder, not elsewhere classified: Secondary | ICD-10-CM | POA: Diagnosis not present

## 2016-01-05 DIAGNOSIS — M25511 Pain in right shoulder: Secondary | ICD-10-CM | POA: Diagnosis not present

## 2016-01-05 DIAGNOSIS — M6281 Muscle weakness (generalized): Secondary | ICD-10-CM | POA: Diagnosis not present

## 2016-01-12 DIAGNOSIS — M25511 Pain in right shoulder: Secondary | ICD-10-CM | POA: Diagnosis not present

## 2016-01-12 DIAGNOSIS — M6281 Muscle weakness (generalized): Secondary | ICD-10-CM | POA: Diagnosis not present

## 2016-01-12 DIAGNOSIS — M25611 Stiffness of right shoulder, not elsewhere classified: Secondary | ICD-10-CM | POA: Diagnosis not present

## 2016-01-14 DIAGNOSIS — M6281 Muscle weakness (generalized): Secondary | ICD-10-CM | POA: Diagnosis not present

## 2016-01-14 DIAGNOSIS — M25611 Stiffness of right shoulder, not elsewhere classified: Secondary | ICD-10-CM | POA: Diagnosis not present

## 2016-01-14 DIAGNOSIS — M25511 Pain in right shoulder: Secondary | ICD-10-CM | POA: Diagnosis not present

## 2016-01-18 DIAGNOSIS — M6281 Muscle weakness (generalized): Secondary | ICD-10-CM | POA: Diagnosis not present

## 2016-01-18 DIAGNOSIS — M25511 Pain in right shoulder: Secondary | ICD-10-CM | POA: Diagnosis not present

## 2016-01-18 DIAGNOSIS — M25611 Stiffness of right shoulder, not elsewhere classified: Secondary | ICD-10-CM | POA: Diagnosis not present

## 2016-01-20 DIAGNOSIS — M25511 Pain in right shoulder: Secondary | ICD-10-CM | POA: Diagnosis not present

## 2016-01-20 DIAGNOSIS — M25611 Stiffness of right shoulder, not elsewhere classified: Secondary | ICD-10-CM | POA: Diagnosis not present

## 2016-01-20 DIAGNOSIS — M6281 Muscle weakness (generalized): Secondary | ICD-10-CM | POA: Diagnosis not present

## 2016-01-24 DIAGNOSIS — M25511 Pain in right shoulder: Secondary | ICD-10-CM | POA: Diagnosis not present

## 2016-01-24 DIAGNOSIS — M6281 Muscle weakness (generalized): Secondary | ICD-10-CM | POA: Diagnosis not present

## 2016-01-24 DIAGNOSIS — M25611 Stiffness of right shoulder, not elsewhere classified: Secondary | ICD-10-CM | POA: Diagnosis not present

## 2016-01-26 DIAGNOSIS — M6281 Muscle weakness (generalized): Secondary | ICD-10-CM | POA: Diagnosis not present

## 2016-01-26 DIAGNOSIS — M25511 Pain in right shoulder: Secondary | ICD-10-CM | POA: Diagnosis not present

## 2016-01-26 DIAGNOSIS — M25611 Stiffness of right shoulder, not elsewhere classified: Secondary | ICD-10-CM | POA: Diagnosis not present

## 2016-01-31 DIAGNOSIS — M6281 Muscle weakness (generalized): Secondary | ICD-10-CM | POA: Diagnosis not present

## 2016-01-31 DIAGNOSIS — M25511 Pain in right shoulder: Secondary | ICD-10-CM | POA: Diagnosis not present

## 2016-01-31 DIAGNOSIS — M25611 Stiffness of right shoulder, not elsewhere classified: Secondary | ICD-10-CM | POA: Diagnosis not present

## 2016-02-07 DIAGNOSIS — M6281 Muscle weakness (generalized): Secondary | ICD-10-CM | POA: Diagnosis not present

## 2016-02-07 DIAGNOSIS — M25511 Pain in right shoulder: Secondary | ICD-10-CM | POA: Diagnosis not present

## 2016-02-07 DIAGNOSIS — M25611 Stiffness of right shoulder, not elsewhere classified: Secondary | ICD-10-CM | POA: Diagnosis not present

## 2016-02-09 DIAGNOSIS — M25511 Pain in right shoulder: Secondary | ICD-10-CM | POA: Diagnosis not present

## 2016-02-09 DIAGNOSIS — M25611 Stiffness of right shoulder, not elsewhere classified: Secondary | ICD-10-CM | POA: Diagnosis not present

## 2016-02-09 DIAGNOSIS — M6281 Muscle weakness (generalized): Secondary | ICD-10-CM | POA: Diagnosis not present

## 2016-02-14 DIAGNOSIS — M25611 Stiffness of right shoulder, not elsewhere classified: Secondary | ICD-10-CM | POA: Diagnosis not present

## 2016-02-14 DIAGNOSIS — M25511 Pain in right shoulder: Secondary | ICD-10-CM | POA: Diagnosis not present

## 2016-02-14 DIAGNOSIS — M6281 Muscle weakness (generalized): Secondary | ICD-10-CM | POA: Diagnosis not present

## 2016-02-16 DIAGNOSIS — M25511 Pain in right shoulder: Secondary | ICD-10-CM | POA: Diagnosis not present

## 2016-02-16 DIAGNOSIS — M6281 Muscle weakness (generalized): Secondary | ICD-10-CM | POA: Diagnosis not present

## 2016-02-16 DIAGNOSIS — M25611 Stiffness of right shoulder, not elsewhere classified: Secondary | ICD-10-CM | POA: Diagnosis not present

## 2016-02-21 DIAGNOSIS — M6281 Muscle weakness (generalized): Secondary | ICD-10-CM | POA: Diagnosis not present

## 2016-02-21 DIAGNOSIS — M25611 Stiffness of right shoulder, not elsewhere classified: Secondary | ICD-10-CM | POA: Diagnosis not present

## 2016-02-21 DIAGNOSIS — M25511 Pain in right shoulder: Secondary | ICD-10-CM | POA: Diagnosis not present

## 2016-02-23 DIAGNOSIS — M6281 Muscle weakness (generalized): Secondary | ICD-10-CM | POA: Diagnosis not present

## 2016-02-23 DIAGNOSIS — M25511 Pain in right shoulder: Secondary | ICD-10-CM | POA: Diagnosis not present

## 2016-02-23 DIAGNOSIS — M25611 Stiffness of right shoulder, not elsewhere classified: Secondary | ICD-10-CM | POA: Diagnosis not present

## 2016-02-28 DIAGNOSIS — M25611 Stiffness of right shoulder, not elsewhere classified: Secondary | ICD-10-CM | POA: Diagnosis not present

## 2016-02-28 DIAGNOSIS — M25511 Pain in right shoulder: Secondary | ICD-10-CM | POA: Diagnosis not present

## 2016-02-28 DIAGNOSIS — M6281 Muscle weakness (generalized): Secondary | ICD-10-CM | POA: Diagnosis not present

## 2016-03-01 DIAGNOSIS — M25511 Pain in right shoulder: Secondary | ICD-10-CM | POA: Diagnosis not present

## 2016-03-01 DIAGNOSIS — M25611 Stiffness of right shoulder, not elsewhere classified: Secondary | ICD-10-CM | POA: Diagnosis not present

## 2016-03-01 DIAGNOSIS — M6281 Muscle weakness (generalized): Secondary | ICD-10-CM | POA: Diagnosis not present

## 2016-03-06 DIAGNOSIS — M6281 Muscle weakness (generalized): Secondary | ICD-10-CM | POA: Diagnosis not present

## 2016-03-06 DIAGNOSIS — M25511 Pain in right shoulder: Secondary | ICD-10-CM | POA: Diagnosis not present

## 2016-03-06 DIAGNOSIS — M25611 Stiffness of right shoulder, not elsewhere classified: Secondary | ICD-10-CM | POA: Diagnosis not present

## 2016-03-07 DIAGNOSIS — Z01419 Encounter for gynecological examination (general) (routine) without abnormal findings: Secondary | ICD-10-CM | POA: Diagnosis not present

## 2016-03-07 DIAGNOSIS — M899 Disorder of bone, unspecified: Secondary | ICD-10-CM | POA: Diagnosis not present

## 2016-03-07 DIAGNOSIS — M810 Age-related osteoporosis without current pathological fracture: Secondary | ICD-10-CM | POA: Diagnosis not present

## 2016-03-07 DIAGNOSIS — Z0000 Encounter for general adult medical examination without abnormal findings: Secondary | ICD-10-CM | POA: Diagnosis not present

## 2016-03-07 DIAGNOSIS — R635 Abnormal weight gain: Secondary | ICD-10-CM | POA: Diagnosis not present

## 2016-03-07 DIAGNOSIS — E7800 Pure hypercholesterolemia, unspecified: Secondary | ICD-10-CM | POA: Diagnosis not present

## 2016-03-07 DIAGNOSIS — Z1231 Encounter for screening mammogram for malignant neoplasm of breast: Secondary | ICD-10-CM | POA: Diagnosis not present

## 2016-03-07 DIAGNOSIS — D649 Anemia, unspecified: Secondary | ICD-10-CM | POA: Diagnosis not present

## 2016-03-07 DIAGNOSIS — I10 Essential (primary) hypertension: Secondary | ICD-10-CM | POA: Diagnosis not present

## 2016-03-07 DIAGNOSIS — E119 Type 2 diabetes mellitus without complications: Secondary | ICD-10-CM | POA: Diagnosis not present

## 2016-03-08 DIAGNOSIS — M6281 Muscle weakness (generalized): Secondary | ICD-10-CM | POA: Diagnosis not present

## 2016-03-08 DIAGNOSIS — M25611 Stiffness of right shoulder, not elsewhere classified: Secondary | ICD-10-CM | POA: Diagnosis not present

## 2016-03-08 DIAGNOSIS — M25511 Pain in right shoulder: Secondary | ICD-10-CM | POA: Diagnosis not present

## 2016-03-15 DIAGNOSIS — M6281 Muscle weakness (generalized): Secondary | ICD-10-CM | POA: Diagnosis not present

## 2016-03-15 DIAGNOSIS — M25511 Pain in right shoulder: Secondary | ICD-10-CM | POA: Diagnosis not present

## 2016-03-15 DIAGNOSIS — M25611 Stiffness of right shoulder, not elsewhere classified: Secondary | ICD-10-CM | POA: Diagnosis not present

## 2016-03-22 DIAGNOSIS — M25511 Pain in right shoulder: Secondary | ICD-10-CM | POA: Diagnosis not present

## 2016-03-22 DIAGNOSIS — M25611 Stiffness of right shoulder, not elsewhere classified: Secondary | ICD-10-CM | POA: Diagnosis not present

## 2016-03-22 DIAGNOSIS — M6281 Muscle weakness (generalized): Secondary | ICD-10-CM | POA: Diagnosis not present

## 2016-03-29 DIAGNOSIS — M25511 Pain in right shoulder: Secondary | ICD-10-CM | POA: Diagnosis not present

## 2016-03-29 DIAGNOSIS — M6281 Muscle weakness (generalized): Secondary | ICD-10-CM | POA: Diagnosis not present

## 2016-03-29 DIAGNOSIS — M25611 Stiffness of right shoulder, not elsewhere classified: Secondary | ICD-10-CM | POA: Diagnosis not present

## 2016-04-03 DIAGNOSIS — Z1211 Encounter for screening for malignant neoplasm of colon: Secondary | ICD-10-CM | POA: Diagnosis not present

## 2016-04-05 DIAGNOSIS — M25611 Stiffness of right shoulder, not elsewhere classified: Secondary | ICD-10-CM | POA: Diagnosis not present

## 2016-04-05 DIAGNOSIS — M6281 Muscle weakness (generalized): Secondary | ICD-10-CM | POA: Diagnosis not present

## 2016-04-05 DIAGNOSIS — M25511 Pain in right shoulder: Secondary | ICD-10-CM | POA: Diagnosis not present

## 2016-04-12 DIAGNOSIS — M25611 Stiffness of right shoulder, not elsewhere classified: Secondary | ICD-10-CM | POA: Diagnosis not present

## 2016-04-12 DIAGNOSIS — M6281 Muscle weakness (generalized): Secondary | ICD-10-CM | POA: Diagnosis not present

## 2016-04-12 DIAGNOSIS — M25511 Pain in right shoulder: Secondary | ICD-10-CM | POA: Diagnosis not present

## 2016-04-19 DIAGNOSIS — M6281 Muscle weakness (generalized): Secondary | ICD-10-CM | POA: Diagnosis not present

## 2016-04-19 DIAGNOSIS — M25511 Pain in right shoulder: Secondary | ICD-10-CM | POA: Diagnosis not present

## 2016-04-19 DIAGNOSIS — M25611 Stiffness of right shoulder, not elsewhere classified: Secondary | ICD-10-CM | POA: Diagnosis not present

## 2016-04-26 DIAGNOSIS — M6281 Muscle weakness (generalized): Secondary | ICD-10-CM | POA: Diagnosis not present

## 2016-04-26 DIAGNOSIS — M25511 Pain in right shoulder: Secondary | ICD-10-CM | POA: Diagnosis not present

## 2016-04-26 DIAGNOSIS — M25611 Stiffness of right shoulder, not elsewhere classified: Secondary | ICD-10-CM | POA: Diagnosis not present

## 2016-05-03 DIAGNOSIS — M25511 Pain in right shoulder: Secondary | ICD-10-CM | POA: Diagnosis not present

## 2016-05-03 DIAGNOSIS — M6281 Muscle weakness (generalized): Secondary | ICD-10-CM | POA: Diagnosis not present

## 2016-05-03 DIAGNOSIS — M25611 Stiffness of right shoulder, not elsewhere classified: Secondary | ICD-10-CM | POA: Diagnosis not present

## 2016-05-05 DIAGNOSIS — M25511 Pain in right shoulder: Secondary | ICD-10-CM | POA: Diagnosis not present

## 2016-05-10 DIAGNOSIS — M6281 Muscle weakness (generalized): Secondary | ICD-10-CM | POA: Diagnosis not present

## 2016-05-10 DIAGNOSIS — M25511 Pain in right shoulder: Secondary | ICD-10-CM | POA: Diagnosis not present

## 2016-05-10 DIAGNOSIS — M25611 Stiffness of right shoulder, not elsewhere classified: Secondary | ICD-10-CM | POA: Diagnosis not present

## 2016-05-25 DIAGNOSIS — J9801 Acute bronchospasm: Secondary | ICD-10-CM | POA: Diagnosis not present

## 2016-05-25 DIAGNOSIS — R05 Cough: Secondary | ICD-10-CM | POA: Diagnosis not present

## 2016-06-02 ENCOUNTER — Ambulatory Visit (INDEPENDENT_AMBULATORY_CARE_PROVIDER_SITE_OTHER): Payer: Medicare HMO | Admitting: Internal Medicine

## 2016-06-02 ENCOUNTER — Encounter: Payer: Self-pay | Admitting: Internal Medicine

## 2016-06-02 DIAGNOSIS — R05 Cough: Secondary | ICD-10-CM | POA: Diagnosis not present

## 2016-06-02 DIAGNOSIS — J387 Other diseases of larynx: Secondary | ICD-10-CM | POA: Insufficient documentation

## 2016-06-02 DIAGNOSIS — R053 Chronic cough: Secondary | ICD-10-CM

## 2016-06-02 LAB — NITRIC OXIDE: Nitric Oxide: 17

## 2016-06-02 MED ORDER — FLUTICASONE PROPIONATE 50 MCG/ACT NA SUSP: 2.0000 | Freq: Every day | NASAL | 2 refills | Status: DC

## 2016-06-02 NOTE — Patient Instructions (Signed)
Chronic cough Cough is from POSSIBLE sinus drainage, acid reflux and vocal cord dysfunction All of this is working together to cause DEFINITE  cyclical cough/LPR cough or cough neuropathy or called CHRONIC REFRACTORY COUGH  #Possible Sinus drainage  - start- start nasal steroid generic fluticasone inhaler 2 squirts each nostril daily as advised   #Possible Acid Reflux  -continue PPI daily on empty stomach (nurse will send script)    avoid colas, spices, cheeses, spirits, red meats, beer, chocolates, fried foods etc.,   - sleep with head end of bed elevated  - eat small frequent meals  - do not go to bed for 3 hours after last meal   #Cyclical cough  - please choose 2-3 days and observe complete voice rest - no talking or whispering  - at all times there  there is urge to cough, drink water or swallow or sip on throat lozenge - refer Debbie Mccarthy voice rehab  #RUle out sinus or lung disease  - Do SInus CT and HRCT chest  #Followup - return to see APP after test results; if these are normal will consider gabapentin or referral to cough trial - any problems call or come sooner

## 2016-06-02 NOTE — Progress Notes (Signed)
Subjective:     Patient ID: Debbie Mccarthy, female   DOB: 18-Jun-1945, 71 y.o.   MRN: [id number]  PCP Lasandra Beech, MD   HPI  IOV 06/02/2016  Chief Complaint  Patient presents with  . Pulmonary Consult    pt reports coughing for 3 years but hydrocodone was given for a broken shoulder and masked the cough for a while but she is off hydrocodone and now chronic persistent cough    71 year old female. 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 She is the daughter of Buel Ream a patient who I took care of for chronic cough a few years ago. According to the patient she's had insidious onset of chronic cough since December 2014 when she suffered a respiratory viral illness with the cough never really resolved. Since then the cough is persistent. It is moderate to severe. It is present throughout the day but can wake up in the middle of the night. She feels most of the cough is coming from her throat. She complains of extreme associated scratchiness of the throat is without a bunch of feathers scraping her throat. She has to clear her throat all the time. There is no associated wheezing or dyspnea. The cough is dry in quality. The cough can be made worse by talking which is part of her profession. It is relieved a little bit by rest. Lozenges can occasionally cooler down. She is not on an ACE inhibitor. She had sinus surgery at age 38. She has acid reflux and is being treated for that. Is no prior history of asthma COPD or pulmonary fibrosis.  Noted: cough always improved with hydrocodone igven for shourlder surgery 3 years ago an 1 year ago. PRior gbapentin did not help  feno 17 ppb and normal  Dr Gretta Cool Reflux Symptom Index (> 13-15 suggestive of LPR cough) 0 -> 5  =  none ->severe problem 06/02/2016   Hoarseness of problem with voice 3  Clearing  Of Throat 4  Excess throat mucus or feeling of post nasal drip 3  Difficulty swallowing food, liquid or tablets 2   Cough after eating or lying down 3  Breathing difficulties or choking episodes 3  Troublesome or annoying cough 5  Sensation of something sticking in throat or lump in throat 5  Heartburn, chest pain, indigestion, or stomach acid coming up 4  TOTAL 31       has a past medical history of Diabetes (HCC); Hypercholesterolemia; and Hypertension.   reports that she has never smoked. She has never used smokeless tobacco.  No past surgical history on file.  Allergies  Allergen Reactions  . Morphine And Related Rash     There is no immunization history on file for this patient.  No family history on file.   Current Outpatient Prescriptions:  .  DULoxetine (CYMBALTA) 60 MG capsule, Take 60 mg by mouth daily., Disp: , Rfl:  .  metFORMIN (GLUCOPHAGE) 500 MG tablet, Take by mouth 2 (two) times daily with a meal., Disp: , Rfl:  .  omeprazole (PRILOSEC) 20 MG capsule, Take 20 mg by mouth daily., Disp: , Rfl:  .  pravastatin (PRAVACHOL) 20 MG tablet, Take 20 mg by mouth daily., Disp: , Rfl:     Review of Systems     Objective:   Physical Exam  Constitutional: She is oriented to person, place, and time. She appears well-developed and well-nourished. No distress.  HENT:  Head: Normocephalic and atraumatic.  Right  Ear: External ear normal.  Left Ear: External ear normal.  Mouth/Throat: Oropharynx is clear and moist. No oropharyngeal exudate.  mallampatti class 3 No post nasal drip Clears throat quit a bit   Eyes: Conjunctivae and EOM are normal. Pupils are equal, round, and reactive to light. Right eye exhibits no discharge. Left eye exhibits no discharge. No scleral icterus.  Neck: Normal range of motion. Neck supple. No JVD present. No tracheal deviation present. No thyromegaly present.  Cardiovascular: Normal rate, regular rhythm, normal heart sounds and intact distal pulses.  Exam reveals no gallop and no friction rub.   No murmur heard. Pulmonary/Chest: Effort normal and  breath sounds normal. No respiratory distress. She has no wheezes. She has no rales. She exhibits no tenderness.  Abdominal: Soft. Bowel sounds are normal. She exhibits no distension and no mass. There is no tenderness. There is no rebound and no guarding.  Musculoskeletal: Normal range of motion. She exhibits no edema or tenderness.  Lymphadenopathy:    She has no cervical adenopathy.  Neurological: She is alert and oriented to person, place, and time. She has normal reflexes. No cranial nerve deficit. She exhibits normal muscle tone. Coordination normal.  Skin: Skin is warm and dry. No rash noted. She is not diaphoretic. No erythema. No pallor.  Psychiatric: She has a normal mood and affect. Her behavior is normal. Judgment and thought content normal.  Vitals reviewed.  Vitals:   06/02/16 1129  BP: 138/78  Pulse: 77  SpO2: 98%  Weight: 176 lb 9.6 oz (80.1 kg)  Height: 5\' 4"  (1.626 m)   Body mass index is 30.31 kg/m.     Assessment:       ICD-9-CM ICD-10-CM   1. Irritable larynx syndrome 478.79 J38.7   2. Chronic cough 786.2 R05 Nitric oxide       Plan:     Chronic cough Cough is from POSSIBLE sinus drainage, acid reflux and vocal cord dysfunction All of this is working together to cause DEFINITE  cyclical cough/LPR cough or cough neuropathy or called CHRONIC REFRACTORY COUGH  #Possible Sinus drainage  - start- start nasal steroid generic fluticasone inhaler 2 squirts each nostril daily as advised   #Possible Acid Reflux  -continue PPI daily on empty stomach (nurse will send script)    avoid colas, spices, cheeses, spirits, red meats, beer, chocolates, fried foods etc.,   - sleep with head end of bed elevated  - eat small frequent meals  - do not go to bed for 3 hours after last meal   #Cyclical cough  - please choose 2-3 days and observe complete voice rest - no talking or whispering  - at all times there  there is urge to cough, drink water or swallow or sip on  throat lozenge - refer Mr Sundra AlandSchinke voice rehab  #RUle out sinus or lung disease  - Do SInus CT and HRCT chest  #Followup - 4-5 weeks - return to see APP after test results; if these are normal will consider gabapentin but understand it did not work. So, we can consider referral to cough trial - any problems call or come sooner     Dr. Kalman ShanMurali Kalyiah Saintil, M.D., [facility] CenterF.C.C.P Pulmonary and Critical Care Medicine Staff Physician [facility] Pager: [phone], If no answer or between  15:00h - 7:00h: call [phone]  06/02/2016 12:15 PM

## 2016-06-02 NOTE — Assessment & Plan Note (Addendum)
Cough is from POSSIBLE sinus drainage, acid reflux and vocal cord dysfunction All of this is working together to cause DEFINITE  cyclical cough/LPR cough or cough neuropathy or called CHRONIC REFRACTORY COUGH  #Possible Sinus drainage  - start- start nasal steroid generic fluticasone inhaler 2 squirts each nostril daily as advised   #Possible Acid Reflux  -continue PPI daily on empty stomach (nurse will send script)    avoid colas, spices, cheeses, spirits, red meats, beer, chocolates, fried foods etc.,   - sleep with head end of bed elevated  - eat small frequent meals  - do not go to bed for 3 hours after last meal   #Cyclical cough  - please choose 2-3 days and observe complete voice rest - no talking or whispering  - at all times there  there is urge to cough, drink water or swallow or sip on throat lozenge - refer Mr Sundra AlandSchinke voice rehab  #RUle out sinus or lung disease  - Do SInus CT and HRCT chest  #Followup - 4-5 weeks - return to see APP after test results; if these are normal will consider gabapentin but understand it did not work. So, we can consider referral to cough trial - any problems call or come sooner

## 2016-06-14 ENCOUNTER — Ambulatory Visit (INDEPENDENT_AMBULATORY_CARE_PROVIDER_SITE_OTHER)
Admission: RE | Admit: 2016-06-14 | Discharge: 2016-06-14 | Disposition: A | Discharge status: Home / Self Care | Payer: Medicare HMO | Source: Ambulatory Visit | Attending: Internal Medicine | Admitting: Internal Medicine

## 2016-06-14 DIAGNOSIS — R05 Cough: Secondary | ICD-10-CM | POA: Diagnosis not present

## 2016-06-14 DIAGNOSIS — R053 Chronic cough: Secondary | ICD-10-CM

## 2016-06-16 ENCOUNTER — Encounter: Payer: Self-pay | Admitting: Emergency Medicine

## 2016-06-16 ENCOUNTER — Telehealth: Payer: Self-pay | Admitting: Internal Medicine

## 2016-06-16 ENCOUNTER — Emergency Department (INDEPENDENT_AMBULATORY_CARE_PROVIDER_SITE_OTHER): Payer: Medicare HMO

## 2016-06-16 ENCOUNTER — Emergency Department
Admission: EM | Admit: 2016-06-16 | Discharge: 2016-06-16 | Disposition: A | Discharge status: Home / Self Care | Payer: Medicare HMO | Source: Home / Self Care | Attending: Family Medicine | Admitting: Family Medicine

## 2016-06-16 DIAGNOSIS — R05 Cough: Secondary | ICD-10-CM | POA: Diagnosis not present

## 2016-06-16 DIAGNOSIS — R0781 Pleurodynia: Secondary | ICD-10-CM

## 2016-06-16 DIAGNOSIS — R0789 Other chest pain: Secondary | ICD-10-CM

## 2016-06-16 MED ORDER — HYDROCODONE-HOMATROPINE 5-1.5 MG/5ML PO SYRP: 5.0000 mL | ORAL_SOLUTION | Freq: Four times a day (QID) | ORAL | 0 refills | Status: DC | PRN

## 2016-06-16 MED ORDER — CYCLOBENZAPRINE HCL 5 MG PO TABS: 5.0000 mg | ORAL_TABLET | Freq: Three times a day (TID) | ORAL | 0 refills | Status: DC | PRN

## 2016-06-16 NOTE — Discharge Instructions (Signed)
°  Please be sure to speak with your primary care provider about the demineralization of your bones seen on the x-ray.  While there are no broken ribs shown today, demineralization can be an indication of osteoporosis, which is gradual weakening of the bones. This can make you more susceptible for fractures later in life from simple falls, etc.  Please discuss further testing with Dr. Sharee PimpleJudge.   Flexeril is a muscle relaxer and may cause drowsiness. Do not drink alcohol, drive, or operate heavy machinery while taking.  Hydromet has hydrocodone in it, which is a narcotic pain medication, do not combine these medications with others containing tylenol. While taking, do not drink alcohol, drive, or perform any other activities that requires focus while taking these medications.   Be careful when taking both medications together or with other sedating medications such as Benadryl or Nyquil as taking too much can lead to overdose- difficulty breathing and even death.

## 2016-06-16 NOTE — Telephone Encounter (Signed)
Called and spoke to pt. Informed her of the results and recs per MR. Pt verbalized understanding and states she went to UC today from pain she is having in her right lateral rib area. Per pt, she was treated with hydromet and cyclobenzaprine. Pt is requesting to be seen sooner than the 06/26/16 appt with TP; pt re-scheduled for 06/19/16 with TP to discuss the results in detail and treatment options per MR. Nothing further needed at this time.

## 2016-06-16 NOTE — Telephone Encounter (Signed)
  Debbie PaneElise (cc to [employer] Parrett): let her know that CT sinus and CT chest are essentially unremarkable without cause for cough Her options are  To try gabapentin (did not work in past) v lyrica (similar to gabapentin but I do not have experience prescribing). v cough research trial @ [facility]. She can discss these with Tammy. 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 Options are research trial +/- Voice rehab +/- [facility] but she might have already gone there  Ct Chest High Resolution  Result Date: 06/14/2016 CLINICAL DATA:  Chronic cough since 2014. No reported history of smoking. EXAM: CT CHEST WITHOUT CONTRAST TECHNIQUE: Multidetector CT imaging of the chest was performed following the standard protocol without intravenous contrast. High resolution imaging of the lungs, as well as inspiratory and expiratory imaging, was performed. COMPARISON:  None. FINDINGS: Cardiovascular: Normal heart size. No significant pericardial fluid/thickening. Left anterior descending, left circumflex and right coronary atherosclerosis. Atherosclerotic nonaneurysmal thoracic aorta. Normal caliber pulmonary arteries. Mediastinum/Nodes: No discrete thyroid nodules. Unremarkable esophagus. No pathologically enlarged axillary, mediastinal or gross hilar lymph nodes, noting limited sensitivity for the detection of hilar adenopathy on this noncontrast study. Lungs/Pleura: No pneumothorax. No pleural effusion. No acute consolidative airspace disease, lung masses or significant pulmonary nodules. No significant regions of subpleural reticulation, ground-glass attenuation, traction bronchiectasis, parenchymal banding, architectural distortion or frank honeycombing. No significant air trapping on the expiration  sequence. Upper abdomen: Small granulomatous calcification in the right liver lobe. Mild diverticulosis in the visualized splenic flexure of the colon. Musculoskeletal: No aggressive appearing focal osseous lesions. Moderate thoracic spondylosis. Right total shoulder arthroplasty. IMPRESSION: 1. No evidence of interstitial lung disease. No active pulmonary disease. 2. Aortic atherosclerosis.  Three-vessel coronary atherosclerosis. Electronically Signed   By: Delbert PhenixJason A Poff M.D.   On: 06/14/2016 14:26   Ct Maxillofacial Ltd Wo Cm  Result Date: 06/14/2016 CLINICAL DATA:  Chronic cough.  Previous sinus surgery. EXAM: CT PARANASAL SINUS LIMITED WITHOUT CONTRAST TECHNIQUE: Non-contiguous multidetector CT images of the paranasal sinuses were obtained in a single plane without contrast. COMPARISON:  None. FINDINGS: The paranasal sinuses and mastoid air cells are clear. There is no significant mucosal thickening or fluid level. Prominent ethmoid bulla are present bilaterally with medial deviation of the ostiomeatal complex on both sides. There is no obstruction. The nasal septum is midline. Nasal cavity is clear. Previous inferior maxillary antrostomy is noted. CT imaging the brain is unremarkable. Atherosclerotic calcifications are present in the aorta without a hyperdense vessel. IMPRESSION: 1. No evidence for acute or chronic sinus disease. 2. Prominent ethmoid bulla bilaterally without obstruction. 3. Atherosclerosis. Electronically Signed   By: Marin Robertshristopher  Mattern M.D.   On: 06/14/2016 12:27

## 2016-06-16 NOTE — ED Provider Notes (Signed)
CSN: [id number]     Arrival date & time 06/16/16  1914 History   First MD Initiated Contact with Patient 06/16/16 [phone]     Chief Complaint  Patient presents with  . Back Pain   (Consider location/radiation/quality/duration/timing/severity/associated sxs/prior Treatment) HPI  Debbie Mccarthy is a 71 y.o. female presenting to UC with hx of chronic cough c/o sudden onset Right side chest pain that started last night after a coughing spell.  Pain is sharp and stabbing but dull at times. Pain is worse with certain movements, deep breathing and coughing.  Pain is severe at times, 5/10 now. She did have 1 hydrocodone pill leftover from Right shoulder surgery last August, which helped dull the pain.  She notes she has had multiple surgeries on Right shoulder and has been in a sling for several months at a time.  Hx of nerve damage in Right shoulder and upper chest wall. She wonders if the muscles on her Right side are also weaker. Denies fever, chills, n/v/d. Besides the pain she feels fine.    Past Medical History:  Diagnosis Date  . Diabetes (HCC)   . Hypercholesterolemia   . Hypertension   . Kidney stones    Past Surgical History:  Procedure Laterality Date  . BACK SURGERY    . NASAL SINUS SURGERY    . TUBAL LIGATION     Family History  Problem Relation Age of Onset  . Emphysema Father   . Asthma Father   . Stroke Father    Social History  Substance Use Topics  . Smoking status: Never Smoker  . Smokeless tobacco: Never Used  . Alcohol use 1.2 oz/week    2 Glasses of wine per week   OB History    No data available     Review of Systems  Constitutional: Negative for appetite change, chills, fatigue and fever.  HENT: Negative for congestion, ear pain and sore throat.   Respiratory: Positive for cough (chronic). Negative for chest tightness, shortness of breath and wheezing.   Cardiovascular: Positive for chest pain (Right side).  Gastrointestinal: Negative for abdominal pain,  diarrhea, nausea and vomiting.  Musculoskeletal: Positive for back pain (Right mid). Negative for myalgias.    Allergies  Morphine and related  Home Medications   Prior to Admission medications   Medication Sig Start Date End Date Taking? Authorizing Provider  cyclobenzaprine (FLEXERIL) 5 MG tablet Take 1 tablet (5 mg total) by mouth 3 (three) times daily as needed for muscle spasms. 06/16/16   Junius Finner, PA-C  DULoxetine (CYMBALTA) 60 MG capsule Take 60 mg by mouth daily.    [provider]  fluticasone (FLONASE) 50 MCG/ACT nasal spray Place 2 sprays into both nostrils daily. 06/02/16   Kalman Shan, MD  HYDROcodone-homatropine (HYDROMET) 5-1.5 MG/5ML syrup Take 5 mLs by mouth every 6 (six) hours as needed for cough. 06/16/16   Junius Finner, PA-C  metFORMIN (GLUCOPHAGE) 500 MG tablet Take by mouth 2 (two) times daily with a meal.    [provider]  omeprazole (PRILOSEC) 20 MG capsule Take 20 mg by mouth daily.    [provider]  pravastatin (PRAVACHOL) 20 MG tablet Take 20 mg by mouth daily.    [provider]   Meds Ordered and Administered this Visit  Medications - No data to display  BP (!) 186/74 (BP Location: Right Arm)   Pulse 84   Temp 97.6 F (36.4 C) (Oral)   Wt 177 lb (80.3 kg)  SpO2 98%   BMI 30.38 kg/m  No data found.   Physical Exam  Constitutional: She is oriented to person, place, and time. She appears well-developed and well-nourished. No distress.  HENT:  Head: Normocephalic and atraumatic.  Right Ear: Tympanic membrane normal.  Left Ear: Tympanic membrane normal.  Nose: Nose normal.  Mouth/Throat: Uvula is midline, oropharynx is clear and moist and mucous membranes are normal.  Eyes: EOM are normal.  Neck: Normal range of motion. Neck supple.  Cardiovascular: Normal rate and regular rhythm.   Pulmonary/Chest: Effort normal and breath sounds normal. No respiratory distress. She has no wheezes. She has no  rales. She exhibits tenderness.  Musculoskeletal: Normal range of motion. She exhibits tenderness. She exhibits no edema.  No midline spinal tenderness. Mild tenderness to Right mid back muscles.   Neurological: She is alert and oriented to person, place, and time.  Skin: Skin is warm and dry. No rash noted. She is not diaphoretic. No erythema.  Psychiatric: She has a normal mood and affect. Her behavior is normal.  Nursing note and vitals reviewed.   Urgent Care Course     Procedures (including critical care time)  Labs Review Labs Reviewed - No data to display  Imaging Review Dg Ribs Unilateral W/chest Right  Result Date: 06/16/2016 CLINICAL DATA:  Cough for several years. Patient felt a pulling sensation in the right rib area while coughing last night. No other known injury. EXAM: RIGHT RIBS AND CHEST - 3+ VIEW COMPARISON:  Chest CT 06/14/2016. FINDINGS: The metallic BB was placed over the area of pain along the inferolateral costal margin on the right. The bones are demineralized. No displaced rib fracture, pleural effusion or pneumothorax identified. The heart size and mediastinal contours are stable. There is mild atelectasis at both lung bases. Previous right shoulder reverse arthroplasty noted. IMPRESSION: No evidence of acute rib fracture, pleural effusion or pneumothorax. Electronically Signed   By: Carey BullocksWilliam  Veazey M.D.   On: 06/16/2016 10:31   Ct Chest High Resolution  Result Date: 06/14/2016 CLINICAL DATA:  Chronic cough since 2014. No reported history of smoking. EXAM: CT CHEST WITHOUT CONTRAST TECHNIQUE: Multidetector CT imaging of the chest was performed following the standard protocol without intravenous contrast. High resolution imaging of the lungs, as well as inspiratory and expiratory imaging, was performed. COMPARISON:  None. FINDINGS: Cardiovascular: Normal heart size. No significant pericardial fluid/thickening. Left anterior descending, left circumflex and right  coronary atherosclerosis. Atherosclerotic nonaneurysmal thoracic aorta. Normal caliber pulmonary arteries. Mediastinum/Nodes: No discrete thyroid nodules. Unremarkable esophagus. No pathologically enlarged axillary, mediastinal or gross hilar lymph nodes, noting limited sensitivity for the detection of hilar adenopathy on this noncontrast study. Lungs/Pleura: No pneumothorax. No pleural effusion. No acute consolidative airspace disease, lung masses or significant pulmonary nodules. No significant regions of subpleural reticulation, ground-glass attenuation, traction bronchiectasis, parenchymal banding, architectural distortion or frank honeycombing. No significant air trapping on the expiration sequence. Upper abdomen: Small granulomatous calcification in the right liver lobe. Mild diverticulosis in the visualized splenic flexure of the colon. Musculoskeletal: No aggressive appearing focal osseous lesions. Moderate thoracic spondylosis. Right total shoulder arthroplasty. IMPRESSION: 1. No evidence of interstitial lung disease. No active pulmonary disease. 2. Aortic atherosclerosis.  Three-vessel coronary atherosclerosis. Electronically Signed   By: Delbert PhenixJason A Poff M.D.   On: 06/14/2016 14:26   Ct Maxillofacial Ltd Wo Cm  Result Date: 06/14/2016 CLINICAL DATA:  Chronic cough.  Previous sinus surgery. EXAM: CT PARANASAL SINUS LIMITED WITHOUT CONTRAST TECHNIQUE: Non-contiguous multidetector CT images of  the paranasal sinuses were obtained in a single plane without contrast. COMPARISON:  None. FINDINGS: The paranasal sinuses and mastoid air cells are clear. There is no significant mucosal thickening or fluid level. Prominent ethmoid bulla are present bilaterally with medial deviation of the ostiomeatal complex on both sides. There is no obstruction. The nasal septum is midline. Nasal cavity is clear. Previous inferior maxillary antrostomy is noted. CT imaging the brain is unremarkable. Atherosclerotic calcifications  are present in the aorta without a hyperdense vessel. IMPRESSION: 1. No evidence for acute or chronic sinus disease. 2. Prominent ethmoid bulla bilaterally without obstruction. 3. Atherosclerosis. Electronically Signed   By: Marin Roberts M.D.   On: 06/14/2016 12:27     MDM   1. Rib pain on right side   2. Right-sided chest wall pain    No evidence of acute injury or illness noted on CXR.  Reassured pt.  Pain likely due to muscle strain from coughing.  Rx: hydromet cough syrup and flexeril Advised not to drink alcohol, drive, or take more than prescribed. Caution taking both at same time.   Rib belt provided for added support and comfort. Moderate improvement immediately after applied to pt.   Discussed finding of bone demineralization on CXR. No hx of osteoporosis. Encouraged to discuss further testing with her PCP.   F/u next week if not improving.    Junius Finner, PA-C 06/16/16 1124

## 2016-06-16 NOTE — ED Triage Notes (Signed)
Pt c/o pain in her right side of back and rib area. States it started last night after she had been coughing a lot. Seeing dr for chronic cough.

## 2016-06-19 ENCOUNTER — Other Ambulatory Visit (INDEPENDENT_AMBULATORY_CARE_PROVIDER_SITE_OTHER): Payer: Medicare HMO

## 2016-06-19 ENCOUNTER — Ambulatory Visit (INDEPENDENT_AMBULATORY_CARE_PROVIDER_SITE_OTHER): Payer: Medicare HMO | Admitting: Adult Health

## 2016-06-19 ENCOUNTER — Encounter: Payer: Self-pay | Admitting: Adult Health

## 2016-06-19 VITALS — BP 112/64 | HR 60 | Ht 64.0 in | Wt 176.8 lb

## 2016-06-19 DIAGNOSIS — R05 Cough: Secondary | ICD-10-CM | POA: Diagnosis not present

## 2016-06-19 DIAGNOSIS — R053 Chronic cough: Secondary | ICD-10-CM

## 2016-06-19 LAB — CBC WITH DIFFERENTIAL/PLATELET
BASOS PCT: 0.6 % (ref 0.0–3.0)
Basophils Absolute: 0 10*3/uL (ref 0.0–0.1)
Eosinophils Absolute: 0.2 10*3/uL (ref 0.0–0.7)
Eosinophils Relative: 3.1 % (ref 0.0–5.0)
HEMATOCRIT: 36.6 % (ref 36.0–46.0)
Hemoglobin: 12.2 g/dL (ref 12.0–15.0)
LYMPHS PCT: 24.3 % (ref 12.0–46.0)
Lymphs Abs: 1.5 10*3/uL (ref 0.7–4.0)
MCHC: 33.3 g/dL (ref 30.0–36.0)
MCV: 87.6 fl (ref 78.0–100.0)
MONOS PCT: 8.8 % (ref 3.0–12.0)
Monocytes Absolute: 0.5 10*3/uL (ref 0.1–1.0)
NEUTROS ABS: 3.8 10*3/uL (ref 1.4–7.7)
NEUTROS PCT: 63.2 % (ref 43.0–77.0)
PLATELETS: 288 10*3/uL (ref 150.0–400.0)
RBC: 4.18 Mil/uL (ref 3.87–5.11)
RDW: 14 % (ref 11.5–15.5)
WBC: 6 10*3/uL (ref 4.0–10.5)

## 2016-06-19 MED ORDER — BENZONATATE 200 MG PO CAPS: 200.0000 mg | ORAL_CAPSULE | Freq: Three times a day (TID) | ORAL | 3 refills | Status: DC | PRN

## 2016-06-19 NOTE — Assessment & Plan Note (Signed)
Chronic cough ? Etiology , possible AR /GERD contrbuting factors.  Spirometry shows more restriction . No obstruction.  Check CBC w/ diff, allergy profile  Workup thus far has been unrevealing with a negative CT sinus and high resolution CT chest. Research team will contact patient regarding possible chronic cough. Study Patient will begin aggressive cough control regimen with treatment aimed at prevention of post nasal drip , cough control , and reflux .   Plan  Patient Instructions  Continue on Prilosec 20 mg daily before meal Add Pepcid 20 mg at bedtime Begin Zyrtec 10 mg in the morning Begin chlorpheniramine 4mg  At bedtime  .  Begin Delsym 2 teaspoons twice daily for cough as needed Begin Tessalon 200 mg 3 times daily for cough as needed Use sips of water to avoid throat clearing and cough Research nurses will call you. Follow with Dr. Marchelle Gearingamaswamy in 6-8 weeks and as needed Please contact office for sooner follow up if symptoms do not improve or worsen or seek emergency care

## 2016-06-19 NOTE — Progress Notes (Signed)
@Patient  ID: Debbie Mccarthy, female    DOB: [birth year], 71 y.o.   MRN: [id number]  Chief Complaint  Patient presents with  . Follow-up    Cough    Referring provider: Lasandra Beech, MD  HPI: 71 year old female never smoker seen for pulmonary consult 06/02/2016 for chronic cough since 2014  TEST  CT sinus 06/14/2016. Negative for acute or chronic sinus disease High resolution CT chest 06/14/2016 was negative for interstitial lung disease. FENO 17 06/02/16     06/19/2016 Follow up ;: Chronic cough  Patient returns for a two-week follow-up. Patient was seen last visit for pulmonary consult for chronic cough that started in December 2014 after respiratory illness. Patient is a never smoker. Cough is waxed and waned has some sensation of throat irritation with frequent throat clearing. Cough is mainly dry. She had use hydrocodone with some relief in the past. Had been tried on gabapentin but did not see any improvement. Patient was set up for CT sinus that was negative for acute or chronic sinus disease. A high resolution CT chest was negative for interstitial lung disease. Nothing to explain etiology for cough.. She had no history of methotrexate, amiodarone, Macrodantin or ACE inhibitor use. Patient was recommended to begin on Flonase. And PPI daily  Since last visit. Patient is feeling no change . Still has cough . Was seen in ER last week after severe cough with pulled rib muscle. XRay neg.  [facility] and tried gabapentin has some help in past.   Spirometry today shows moderate restriction ( has rib binder on) FEV1 63%, ratio 80, FVC 60%.   We discussed research studies for chronic cough . She is interested to hear about study .   Allergies  Allergen Reactions  . Morphine And Related Rash     There is no immunization history on file for this patient.  Past Medical History:  Diagnosis Date  . Diabetes (HCC)   . Hypercholesterolemia   . Hypertension   .  Kidney stones     Tobacco History: History  Smoking Status  . Never Smoker  Smokeless Tobacco  . Never Used   Counseling given: Not Answered   Outpatient Encounter Prescriptions as of 06/19/2016  Medication Sig  . cyclobenzaprine (FLEXERIL) 5 MG tablet Take 1 tablet (5 mg total) by mouth 3 (three) times daily as needed for muscle spasms.  . DULoxetine (CYMBALTA) 60 MG capsule Take 60 mg by mouth daily.  . fluticasone (FLONASE) 50 MCG/ACT nasal spray Place 2 sprays into both nostrils daily.  Marland Kitchen HYDROcodone-homatropine (HYDROMET) 5-1.5 MG/5ML syrup Take 5 mLs by mouth every 6 (six) hours as needed for cough.  . metFORMIN (GLUCOPHAGE) 500 MG tablet Take by mouth 2 (two) times daily with a meal.  . omeprazole (PRILOSEC) 20 MG capsule Take 20 mg by mouth daily.  . pravastatin (PRAVACHOL) 20 MG tablet Take 20 mg by mouth daily.  . benzonatate (TESSALON) 200 MG capsule Take 1 capsule (200 mg total) by mouth 3 (three) times daily as needed for cough.   No facility-administered encounter medications on file as of 06/19/2016.      Review of Systems  Constitutional:   No  weight loss, night sweats,  Fevers, chills, fatigue, or  lassitude.  HEENT:   No headaches,  Difficulty swallowing,  Tooth/dental problems, or  Sore throat,                No sneezing, itching, ear ache, +nasal congestion,  post nasal drip,   CV:  No chest pain,  Orthopnea, PND, swelling in lower extremities, anasarca, dizziness, palpitations, syncope.   GI  No heartburn, indigestion, abdominal pain, nausea, vomiting, diarrhea, change in bowel habits, loss of appetite, bloody stools.   Resp:   No chest wall deformity  Skin: no rash or lesions.  GU: no dysuria, change in color of urine, no urgency or frequency.  No flank pain, no hematuria   MS:  No joint pain or swelling.  No decreased range of motion.  No back pain.    Physical Exam  BP 112/64 (BP Location: Left Arm, Cuff Size: Normal)   Pulse 60   Ht 5\' 4"   (1.626 m)   Wt 176 lb 12.8 oz (80.2 kg)   SpO2 96%   BMI 30.35 kg/m   GEN: A/Ox3; pleasant , NAD, well nourished    HEENT:  [place]/AT,  EACs-clear, TMs-wnl, NOSE-clear, THROAT-clear, no lesions, no postnasal drip or exudate noted.   NECK:  Supple w/ fair ROM; no JVD; normal carotid impulses w/o bruits; no thyromegaly or nodules palpated; no lymphadenopathy.    RESP  Clear  P & A; w/o, wheezes/ rales/ or rhonchi. no accessory muscle use, no dullness to percussion  CARD:  RRR, no m/r/g, no peripheral edema, pulses intact, no cyanosis or clubbing.  GI:   Soft & nt; nml bowel sounds; no organomegaly or masses detected.   Musco: Warm bil, no deformities or joint swelling noted.   Neuro: alert, no focal deficits noted.    Skin: Warm, no lesions or rashes   Lab Results:  CBC No results found for: WBC, RBC, HGB, HCT, PLT, MCV, MCH, MCHC, RDW, LYMPHSABS, MONOABS, EOSABS, BASOSABS  BMET    Component Value Date/Time   NA 142 06/17/2007 1733   K 4.4 06/17/2007 1733   CL 107 06/17/2007 1733   CO2 25 06/17/2007 1733   GLUCOSE 129 (H) 06/17/2007 1733   BUN 16 06/17/2007 1733   CREATININE 0.74 06/17/2007 1733   CALCIUM 9.4 06/17/2007 1733    BNP No results found for: BNP  ProBNP No results found for: PROBNP  Imaging: Dg Ribs Unilateral W/chest Right  Result Date: 06/16/2016 CLINICAL DATA:  Cough for several years. Patient felt a pulling sensation in the right rib area while coughing last night. No other known injury. EXAM: RIGHT RIBS AND CHEST - 3+ VIEW COMPARISON:  Chest CT 06/14/2016. FINDINGS: The metallic BB was placed over the area of pain along the inferolateral costal margin on the right. The bones are demineralized. No displaced rib fracture, pleural effusion or pneumothorax identified. The heart size and mediastinal contours are stable. There is mild atelectasis at both lung bases. Previous right shoulder reverse arthroplasty noted. IMPRESSION: No evidence of acute rib  fracture, pleural effusion or pneumothorax. Electronically Signed   By: Carey Bullocks M.D.   On: 06/16/2016 10:31   Ct Chest High Resolution  Result Date: 06/14/2016 CLINICAL DATA:  Chronic cough since 2014. No reported history of smoking. EXAM: CT CHEST WITHOUT CONTRAST TECHNIQUE: Multidetector CT imaging of the chest was performed following the standard protocol without intravenous contrast. High resolution imaging of the lungs, as well as inspiratory and expiratory imaging, was performed. COMPARISON:  None. FINDINGS: Cardiovascular: Normal heart size. No significant pericardial fluid/thickening. Left anterior descending, left circumflex and right coronary atherosclerosis. Atherosclerotic nonaneurysmal thoracic aorta. Normal caliber pulmonary arteries. Mediastinum/Nodes: No discrete thyroid nodules. Unremarkable esophagus. No pathologically enlarged axillary, mediastinal or gross hilar lymph nodes,  noting limited sensitivity for the detection of hilar adenopathy on this noncontrast study. Lungs/Pleura: No pneumothorax. No pleural effusion. No acute consolidative airspace disease, lung masses or significant pulmonary nodules. No significant regions of subpleural reticulation, ground-glass attenuation, traction bronchiectasis, parenchymal banding, architectural distortion or frank honeycombing. No significant air trapping on the expiration sequence. Upper abdomen: Small granulomatous calcification in the right liver lobe. Mild diverticulosis in the visualized splenic flexure of the colon. Musculoskeletal: No aggressive appearing focal osseous lesions. Moderate thoracic spondylosis. Right total shoulder arthroplasty. IMPRESSION: 1. No evidence of interstitial lung disease. No active pulmonary disease. 2. Aortic atherosclerosis.  Three-vessel coronary atherosclerosis. Electronically Signed   By: Delbert PhenixJason A Poff M.D.   On: 06/14/2016 14:26   Ct Maxillofacial Ltd Wo Cm  Result Date: 06/14/2016 CLINICAL DATA:   Chronic cough.  Previous sinus surgery. EXAM: CT PARANASAL SINUS LIMITED WITHOUT CONTRAST TECHNIQUE: Non-contiguous multidetector CT images of the paranasal sinuses were obtained in a single plane without contrast. COMPARISON:  None. FINDINGS: The paranasal sinuses and mastoid air cells are clear. There is no significant mucosal thickening or fluid level. Prominent ethmoid bulla are present bilaterally with medial deviation of the ostiomeatal complex on both sides. There is no obstruction. The nasal septum is midline. Nasal cavity is clear. Previous inferior maxillary antrostomy is noted. CT imaging the brain is unremarkable. Atherosclerotic calcifications are present in the aorta without a hyperdense vessel. IMPRESSION: 1. No evidence for acute or chronic sinus disease. 2. Prominent ethmoid bulla bilaterally without obstruction. 3. Atherosclerosis. Electronically Signed   By: Marin Robertshristopher  Mattern M.D.   On: 06/14/2016 12:27     Assessment & Plan:   Chronic cough Chronic cough ? Etiology , possible AR /GERD contrbuting factors.  Spirometry shows more restriction . No obstruction.  Check CBC w/ diff, allergy profile  Workup thus far has been unrevealing with a negative CT sinus and high resolution CT chest. Research team will contact patient regarding possible chronic cough. Study Patient will begin aggressive cough control regimen with treatment aimed at prevention of post nasal drip , cough control , and reflux .   Plan  Patient Instructions  Continue on Prilosec 20 mg daily before meal Add Pepcid 20 mg at bedtime Begin Zyrtec 10 mg in the morning Begin chlorpheniramine 4mg  At bedtime  .  Begin Delsym 2 teaspoons twice daily for cough as needed Begin Tessalon 200 mg 3 times daily for cough as needed Use sips of water to avoid throat clearing and cough Research nurses will call you. Follow with Dr. Marchelle Gearingamaswamy in 6-8 weeks and as needed Please contact office for sooner follow up if symptoms do  not improve or worsen or seek emergency care         Rubye Oaksammy Parrett, NP 06/19/2016

## 2016-06-19 NOTE — Patient Instructions (Signed)
Continue on Prilosec 20 mg daily before meal Add Pepcid 20 mg at bedtime Begin Zyrtec 10 mg in the morning Begin chlorpheniramine 4mg  At bedtime  .  Begin Delsym 2 teaspoons twice daily for cough as needed Begin Tessalon 200 mg 3 times daily for cough as needed Use sips of water to avoid throat clearing and cough Research nurses will call you. Follow with Dr. Marchelle Gearingamaswamy in 6-8 weeks and as needed Please contact office for sooner follow up if symptoms do not improve or worsen or seek emergency care

## 2016-06-20 LAB — RESPIRATORY ALLERGY PROFILE REGION II ~~LOC~~
Allergen, A. alternata, m6: <0.1 kU/L
Allergen, Cottonwood, t14: <0.1 kU/L
Allergen, D pternoyssinus,d7: <0.1 kU/L
Allergen, Oak,t7: <0.1 kU/L
Allergen, P. notatum, m1: <0.1 kU/L
Aspergillus fumigatus, m3: <0.1 kU/L
Box Elder IgE: <0.1 kU/L
Cat Dander: <0.1 kU/L
Cockroach: <0.1 kU/L
Common Ragweed: <0.1 kU/L
D. farinae: <0.1 kU/L
IGE (IMMUNOGLOBULIN E), SERUM: 72 kU/L (ref <115)
Johnson Grass: <0.1 kU/L
Pecan/Hickory Tree IgE: <0.1 kU/L
Rough Pigweed  IgE: <0.1 kU/L
Sheep Sorrel IgE: <0.1 kU/L
Timothy Grass: <0.1 kU/L

## 2016-06-26 ENCOUNTER — Ambulatory Visit: Payer: Medicare HMO | Admitting: Adult Health

## 2016-06-28 ENCOUNTER — Ambulatory Visit: Payer: Medicare HMO | Attending: Internal Medicine | Admitting: Speech Pathology

## 2016-06-28 DIAGNOSIS — R498 Other voice and resonance disorders: Secondary | ICD-10-CM | POA: Insufficient documentation

## 2016-06-28 NOTE — Therapy (Signed)
[facility] [address] [place], [place], [address] Phone: [phone]   Fax:  [phone]  Speech Language Pathology Evaluation  Patient Details  Name: Debbie Mccarthy MRN: [id number] Date of Birth: [birth year] Referring Provider: Dr. Kalman ShanMurali Mccarthy  Encounter Date: 06/28/2016      End of Session - 06/28/16 1407    Visit Number 1   Number of Visits 17   Date for SLP Re-Evaluation 08/25/16   Authorization Type [employer] MCR - auth required $45.00 co pay - submitted for approval   SLP Start Time 1318   SLP Stop Time  1400   SLP Time Calculation (min) 42 min   Activity Tolerance Patient tolerated treatment well      Past Medical History:  Diagnosis Date  . Diabetes (HCC)   . Hypercholesterolemia   . Hypertension   . Kidney stones     Past Surgical History:  Procedure Laterality Date  . BACK SURGERY    . NASAL SINUS SURGERY    . TUBAL LIGATION      There were no vitals filed for this visit.      Subjective Assessment - 06/28/16 1359    Subjective "I don't know when I'm going to cough, it just comes on and I can't stop it"            SLP Evaluation [facility] - 06/28/16 1400      SLP Visit Information   SLP Received On 06/28/16   Referring Provider Dr. Kalman ShanMurali Mccarthy   Onset Date December 2014   Medical Diagnosis Irritable Larynx     Subjective   Patient/Family Stated Goal To stop couhing     General Information   HPI 71 year old female. 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 She is the daughter of Buel Reamndrea Mccarthy a patient who saw Dr. Marchelle Gearingamaswamy for chronic cough a few years ago. According to the patient she's had insidious onset of chronic cough since December 2014 when she suffered a respiratory viral illness with the cough never really resolved. Since then the cough is persistent. It is moderate to severe. It is present throughout the day but can wake up in the middle of the  night. She feels most of the cough is coming from her throat. She complains of extreme associated scratchiness of the throat is without a bunch of feathers scraping her throat. She has to clear her throat all the time. There is no associated wheezing or dyspnea. The cough is dry in quality. The cough can be made worse by talking which is part of her profession. It is relieved a little bit by rest. Lozenges can occasionally cooler down. She is not on an ACE inhibitor. She had sinus surgery at age 71. She has acid reflux and is being treated for that. Is no prior history of asthma COPD or pulmonary fibrosis.   Mobility Status walks independently, denies falls     Prior Functional Status   Cognitive/Linguistic Baseline Within functional limits   Type of Home House    Lives With Significant other   Vocation Part time employment     Cognition   Overall Cognitive Status Within Functional Limits for tasks assessed     Verbal Expression   Overall Verbal Expression Appears within functional limits for tasks assessed     Oral Motor/Sensory Function   Overall Oral Motor/Sensory Function Appears within functional limits for tasks assessed     Motor Speech   Overall Motor  Speech Impaired   Respiration Impaired   Level of Impairment Conversation   Phonation Hoarse  strained   Resonance Within functional limits   Articulation Within functional limitis   Interfering Components Hearing loss   Phonation Impaired   Vocal Abuses Habitual Hyperphonia;Habitual Cough/Throat Clear;Prolonged Vocal Use  VCD   Tension Present Neck;Shoulder   Volume Appropriate   Pitch Low                         SLP Education - 06/28/16 1406    Education provided Yes   Education Details Abdmonimal breathing for VCD, VCD and LPR education, voice conservation    Person(s) Educated Patient   Methods Explanation;Demonstration;Verbal cues;Handout   Comprehension Verbalized understanding;Verbal cues  required;Need further instruction          SLP Short Term Goals - 06/28/16 1423      SLP SHORT TERM GOAL #1   Title Pt will converse for 10 minutes using abdominal breathing with occasional min A   Time 4   Period Weeks   Status New     SLP SHORT TERM GOAL #2   Title Pt will report carryover of relaxation techniques and pursed lip breathing 2x outside of therapy over 3 sessions   Time 4   Period Weeks   Status New     SLP SHORT TERM GOAL #3   Title Pt will report decreased frequency of VCD symptoms outside of therapy by 25% subjectively   Time 4   Period Weeks   Status New          SLP Long Term Goals - 06/28/16 1426      SLP LONG TERM GOAL #1   Title Pt will report compensatory strategies of pursed sniff breathing and relaxation outside of therapy over 6 sessions   Time 8   Period Weeks   Status New     SLP LONG TERM GOAL #2   Title Pt will converse for 15 minutes with abdominal breathing with rare min A   Time 8   Period Weeks   Status New     SLP LONG TERM GOAL #3   Title Pt will report decreased frequency of VCD symptoms outside of therapy by 50%   Time 8   Period Weeks   Status New          Plan - 06/28/16 1408    Clinical Impression Statement Ms. Name, a 71 y.o. female referred for vocal cord dysfunction by pulmonologist. [name] reports 4 year history of moderate to severe chronic cough. She has sought treatment for this by her PCP, ENT, and most recently required a visit to urgent care with a pulled rib muscle due to severity of cough. She is currently on PPI, and has not yet started Tessalon for cough. [name] reports h/o of being off and on pain meds for her shoulder. She reports cough is redueced on pain meds. Today, she presents with moderate to severe VCD. She was observed to have coughing episodes 4 times in 40 minutes, as well as multiple throat clearing. Her voice his quite hoarse/harsh with obvious tension in her neck and shoulders. Her Reflux  Symptomm Indicator (RSI score) at [name] was 31, with 13-15 indicating VCD. Ms. Dobberstein states this cough as affected her participation in social, religious and work activities. She identified strong odors, LPR, emotions and stress as triggers for VCD.  I recommend skilled ST to train pt in  compensations to reduce incidence and duration of VCD episodes.    Today, I trained Ms. Walrond in abdmonimal breathing, reducing tension in her neck and shoulders, pursed lip breathing. Due to hoarse strained voice, Ms. Armenteros was trained in confidential voice and environmental compensations for voice conservation.    Speech Therapy Frequency 2x / week   Duration --  8 weeks or total of 17 visits   Treatment/Interventions Cueing hierarchy;Functional tasks;Patient/family education;Internal/external aids;SLP instruction and feedback;Compensatory techniques;Environmental controls   Potential to Achieve Goals Good   Potential Considerations Severity of impairments;Financial resources  verbalized concern re: co-pay   SLP Home Exercise Plan abdominal breathing for VCD   Consulted and Agree with Plan of Care Patient      Patient will benefit from skilled therapeutic intervention in order to improve the following deficits and impairments:   Other voice and resonance disorders - Plan: SLP plan of care cert/re-cert      G-Codes - July 18, 2016 1430    Functional Limitations Voice   Voice Current Status (G9171) At least 60 percent but less than 80 percent impaired, limited or restricted   Voice Goal Status (G9172) At least 20 percent but less than 40 percent impaired, limited or restricted      Problem List Patient Active Problem List   Diagnosis Date Noted  . Chronic cough 06/02/2016  . Irritable larynx syndrome 06/02/2016  . CARDIOMYOPATHY 06/19/2007  . OSTEOPENIA 06/19/2007  . DYSPNEA ON EXERTION 06/19/2007  . HYPERLIPIDEMIA 06/17/2007  . BLURRED VISION 06/17/2007  . HYPERTENSION 06/17/2007  . GERD  06/17/2007  . DYSPEPSIA 06/17/2007  . ARTHRALGIA, SHOULDER 06/17/2007  . POSTLAMINECTOMY SYNDROME THORACIC REGION 06/17/2007  . CERVICALGIA 06/17/2007  . BACK PAIN, LUMBAR, WITH RADICULOPATHY 06/17/2007  . CHEST WALL PAIN, ANTERIOR 06/17/2007    Kjell Brannen, Radene Journey MS, CCC-SLP 2016/07/18, 2:35 PM  [facility] [address] [place], [place], [address] Phone: [phone]   Fax:  [phone]  Name: Debbie Mccarthy MRN: [id number] Date of Birth: August 18, 1945

## 2016-06-28 NOTE — Patient Instructions (Addendum)
   ABDOMINAL BREATHING FOR VCD   . Shoulders down - this is a cue to relax . Place your hand on your abdomen - this helps you focus on easy abdominal breath support - the best and most relaxed way to breathe . Breathe in through your nose and fill your belly with air, watching your hand move outward . Breathe out through your mouth and watch your belly move in. An audible "sh"  may help   Think of your belly as a balloon, when you fill with air (inhale), the balloon gets bigger. As the air goes out (exhale), the balloon deflates.  If you are having difficulty coordinating this, lay on your back with a plastic cup on your belly and repeat the above steps, watching you belly move up with inhalation and down with exhalations  Practice breathing in and out in front of a mirror, watching your belly Breathe in for a count of 5 and breathe out for a count of 5  Now as you breathe out, get a picture of relaxing in your mind Feel the constant in-out of your breathing with your belly Picture the tension in your throat and chest evaporate like steam, melting away and FEEL it do so Picture your throat opening up so wide that a grapefruit or softball could fit through your throat.   Practice this throughout the day when you are not having symptoms. For example: in the car, when watching TV, before medications. Regular practice when you are feeling well is important.  Make it automatic and use it at the first sense of throat tightness to prevent or suppress the VCD. You may start with the inhale or exhale.   Be patient when completing the breathing. It may take several minutes to start feeling relief  Use the breathing to "pre-treat" yourself before a known trigger for VCD. Possible triggers could be: change in air temperature, strong odors, perfume, and exercise.   There's an App for that: Breathe2relax  Provided by: Itzel Mckibbin LovvorClinton Sawyern. SLP [phone]    Get the persons attention before you  speak  Use eye contact and face the person you are speaking to  Be in close proximity to the person you are speaking to  Use soft, confidential voice  Turn down any noise in the environment such as the TV, walk away from loud appliances, air conditioners, fans, dish washers etc  Vocal rest as possible

## 2016-06-29 DIAGNOSIS — E1140 Type 2 diabetes mellitus with diabetic neuropathy, unspecified: Secondary | ICD-10-CM | POA: Diagnosis not present

## 2016-06-29 DIAGNOSIS — E7800 Pure hypercholesterolemia, unspecified: Secondary | ICD-10-CM | POA: Diagnosis not present

## 2016-06-29 DIAGNOSIS — I10 Essential (primary) hypertension: Secondary | ICD-10-CM | POA: Diagnosis not present

## 2016-06-29 DIAGNOSIS — E039 Hypothyroidism, unspecified: Secondary | ICD-10-CM | POA: Diagnosis not present

## 2016-07-18 ENCOUNTER — Ambulatory Visit: Payer: Medicare HMO | Attending: Internal Medicine

## 2016-07-18 DIAGNOSIS — R498 Other voice and resonance disorders: Secondary | ICD-10-CM | POA: Diagnosis not present

## 2016-07-18 NOTE — Therapy (Signed)
[facility] [address] [place], [place], [address] Phone: [phone]   Fax:  [phone]  Speech Language Pathology Treatment  Patient Details  Name: Debbie Mccarthy MRN: [id number] Date of Birth: October 10, 1945 Referring Provider: Dr. Kalman ShanMurali Ramaswamy  Encounter Date: 07/18/2016      End of Session - 07/18/16 1105    Visit Number 2   Number of Visits 17   Date for SLP Re-Evaluation 08/25/16   SLP Start Time 1023   SLP Stop Time  1103   SLP Time Calculation (min) 40 min   Activity Tolerance Patient tolerated treatment well      Past Medical History:  Diagnosis Date  . Diabetes (HCC)   . Hypercholesterolemia   . Hypertension   . Kidney stones     Past Surgical History:  Procedure Laterality Date  . BACK SURGERY    . NASAL SINUS SURGERY    . TUBAL LIGATION      There were no vitals filed for this visit.      Subjective Assessment - 07/18/16 1042    Subjective Pt reports coughing episode in church on Sunday.               ADULT SLP TREATMENT - 07/18/16 1052      General Information   Behavior/Cognition Alert;Cooperative;Pleasant mood     Treatment Provided   Treatment provided Cognitive-Linquistic     Cognitive-Linquistic Treatment   Treatment focused on Voice   Skilled Treatment Pt with strained voice notable, and talking on residual volume leading to tight voice. SLP educated pt re: sniff-gentle blow, imagery of relaxation (beach walking mid morning with sig other), imagery of tightness and cough relaxing and releasing with evaporation/melting. Pt wrote these techniques down.       Assessment / Recommendations / Plan   Plan Continue with current plan of care     Progression Toward Goals   Progression toward goals Progressing toward goals          SLP Education - 07/18/16 1105    Education Details relaxation techniques   Person(s) Educated Patient   Methods  Demonstration;Explanation;Verbal cues;Handout   Comprehension Verbalized understanding;Returned demonstration;Verbal cues required;Need further instruction          SLP Short Term Goals - 07/18/16 1247      SLP SHORT TERM GOAL #1   Title Pt will converse for 10 minutes using abdominal breathing with occasional min A   Time 4   Period Weeks   Status On-going     SLP SHORT TERM GOAL #2   Title Pt will report carryover of relaxation techniques and pursed lip breathing 2x outside of therapy over 3 sessions   Time 4   Period Weeks   Status On-going     SLP SHORT TERM GOAL #3   Title Pt will report decreased frequency of VCD symptoms outside of therapy by 25% subjectively   Time 4   Period Weeks   Status On-going          SLP Long Term Goals - 07/18/16 1247      SLP LONG TERM GOAL #1   Title Pt will report compensatory strategies of pursed sniff breathing and relaxation outside of therapy over 6 sessions   Time 8   Period Weeks   Status On-going     SLP LONG TERM GOAL #2   Title Pt will converse for 15 minutes with abdominal breathing with rare min A   Time  8   Period Weeks   Status On-going     SLP LONG TERM GOAL #3   Title Pt will report decreased frequency of VCD symptoms outside of therapy by 50%   Time 8   Period Weeks   Status On-going          Plan - 07/18/16 1244    Clinical Impression Statement Pt presents today with strained voice and rushed voice speaking on residual volume. 3 throat clears today. SLP spent today educating pt about relaxation techniques to employ when she has a coughing moment/episode. Pt practiced sniff-gentle blow and imagery techniques today during session with good success. Cont'd ST necessary for improving pt's ability to communicate effectively without throat clearing/coughing.   Speech Therapy Frequency 2x / week   Duration --  8 weeks or total of 17 visits   Treatment/Interventions Cueing hierarchy;Functional  tasks;Patient/family education;Internal/external aids;SLP instruction and feedback;Compensatory techniques;Environmental controls   Potential to Achieve Goals Good   Potential Considerations Severity of impairments;Financial resources  verbalized concern re: co-pay      Patient will benefit from skilled therapeutic intervention in order to improve the following deficits and impairments:   Other voice and resonance disorders    Problem List Patient Active Problem List   Diagnosis Date Noted  . Chronic cough 06/02/2016  . Irritable larynx syndrome 06/02/2016  . CARDIOMYOPATHY 06/19/2007  . OSTEOPENIA 06/19/2007  . DYSPNEA ON EXERTION 06/19/2007  . HYPERLIPIDEMIA 06/17/2007  . BLURRED VISION 06/17/2007  . HYPERTENSION 06/17/2007  . GERD 06/17/2007  . DYSPEPSIA 06/17/2007  . ARTHRALGIA, SHOULDER 06/17/2007  . POSTLAMINECTOMY SYNDROME THORACIC REGION 06/17/2007  . CERVICALGIA 06/17/2007  . BACK PAIN, LUMBAR, WITH RADICULOPATHY 06/17/2007  . CHEST WALL PAIN, ANTERIOR 06/17/2007    [facility] ,MS, CCC-SLP  07/18/2016, 12:47 PM  [facility] [address] [place], [place], [address] Phone: [phone]   Fax:  [phone]   Name: Debbie Mccarthy MRN: [id number] Date of Birth: [birth year]

## 2016-07-18 NOTE — Patient Instructions (Signed)
WAYS TO RELAX Beach image Image of tightness/coughing releasing into steam or melting Sniff technique

## 2016-07-21 ENCOUNTER — Ambulatory Visit: Payer: Medicare HMO

## 2016-07-21 DIAGNOSIS — R498 Other voice and resonance disorders: Secondary | ICD-10-CM

## 2016-07-21 NOTE — Patient Instructions (Signed)
   Practice breathing relaxed without "darth vader breathing", and FEEL the relaxation in your throat for 10-15 minutes twice a day. When you have 2-3 days of feeling this 90% or more of the time, then add a plastic cup when you breath and watch it rise when you breath in, and fall when you let the air out.

## 2016-07-21 NOTE — Therapy (Signed)
[facility] [address] [place], [place], [address] Phone: [phone]   Fax:  [phone]  Speech Language Pathology Treatment  Patient Details  Name: Debbie BankerMarlene F Mccarthy MRN: [id number] Date of Birth: 01/02/1946 Referring Provider: Dr. Kalman ShanMurali Ramaswamy  Encounter Date: 07/21/2016      End of Session - 07/21/16 1121    Visit Number 3   Number of Visits 17   Date for SLP Re-Evaluation 08/25/16   SLP Start Time 1021  6 minutes late   SLP Stop Time  1100   SLP Time Calculation (min) 39 min   Activity Tolerance Patient tolerated treatment well      Past Medical History:  Diagnosis Date  . Diabetes (HCC)   . Hypercholesterolemia   . Hypertension   . Kidney stones     Past Surgical History:  Procedure Laterality Date  . BACK SURGERY    . NASAL SINUS SURGERY    . TUBAL LIGATION      There were no vitals filed for this visit.             ADULT SLP TREATMENT - 07/21/16 1106      General Information   Behavior/Cognition Alert;Cooperative;Pleasant mood     Treatment Provided   Treatment provided Cognitive-Linquistic     Cognitive-Linquistic Treatment   Treatment focused on Voice   Skilled Treatment Abdominal breathing (AB) was targeted today. SLP took pt to PT mat and practiced AB, However pt with noisy breathing indicative of partial vocal fold adduction. To relax vocal folds, SLP demonstrated "hooommme" (home) with softest voice possible and rounded oral cavity/labial musculature. After 7-8 reps, this was successful in eliminating adduction sound during respiration. Later in session pt had initial (1 second) of adduction inhallation and immediately changed/modified to silent inhalation wihtout detection of partial vocal fold adduction. SLP told pt to practice silent/relaxed breathing 10-15 minutes, twice per day, and when she notes 2-3 days of silent breathing and no tension in thyroid area to place  plastic cup on abdomen and watch cup move upwards on inhalation and downwards with release of air. Pt voiced understanding.     Assessment / Recommendations / Plan   Plan Continue with current plan of care     Progression Toward Goals   Progression toward goals Progressing toward goals          SLP Education - 07/21/16 1120    Education provided Yes   Education Details relaxation of vocal folds with elongated "home" vocalization, how vocal folds look partially adducted vs. at rest during breathing, Abdominal breathing   Person(s) Educated Patient   Methods Explanation;Demonstration;Tactile cues;Verbal cues   Comprehension Verbalized understanding;Returned demonstration;Verbal cues required;Need further instruction          SLP Short Term Goals - 07/21/16 1124      SLP SHORT TERM GOAL #1   Title Pt will converse for 10 minutes using abdominal breathing with occasional min A   Time 4   Period Weeks   Status On-going     SLP SHORT TERM GOAL #2   Title Pt will report carryover of relaxation techniques and pursed lip breathing 2x outside of therapy over 3 sessions   Time 4   Period Weeks   Status On-going     SLP SHORT TERM GOAL #3   Title Pt will report decreased frequency of VCD symptoms outside of therapy by 25% subjectively   Time 4   Period Weeks   Status On-going  SLP Long Term Goals - 07/21/16 1125      SLP LONG TERM GOAL #1   Title Pt will report compensatory strategies of pursed sniff breathing and relaxation outside of therapy over 6 sessions   Time 8   Period Weeks   Status On-going     SLP LONG TERM GOAL #2   Title Pt will converse for 15 minutes with abdominal breathing with rare min A   Time 8   Period Weeks   Status On-going     SLP LONG TERM GOAL #3   Title Pt will report decreased frequency of VCD symptoms outside of therapy by 50%   Time 8   Period Weeks   Status On-going          Plan - 07/21/16 1122    Clinical Impression  Statement Strained voice and speaking on residual volume persist. One cough today. SLP educated/re-educated pt about abdominal breathing (AB) and also relaxation techniques to relax vocal folds during respiration. By session end, pt demo'd breathing that would be indicative of relaxed vocal folds vs. partially adducted folds. Cont'd ST necessary for improving pt's ability to communicate effectively without throat clearing/coughing.   Speech Therapy Frequency 2x / week   Duration --  8 weeks or total 17 visits   Treatment/Interventions Cueing hierarchy;Functional tasks;Patient/family education;Internal/external aids;SLP instruction and feedback;Compensatory techniques;Environmental controls   Potential to Achieve Goals Good   Potential Considerations Severity of impairments;Financial resources   SLP Home Exercise Plan abdominal breathing for VCD      Patient will benefit from skilled therapeutic intervention in order to improve the following deficits and impairments:   Other voice and resonance disorders    Problem List Patient Active Problem List   Diagnosis Date Noted  . Chronic cough 06/02/2016  . Irritable larynx syndrome 06/02/2016  . CARDIOMYOPATHY 06/19/2007  . OSTEOPENIA 06/19/2007  . DYSPNEA ON EXERTION 06/19/2007  . HYPERLIPIDEMIA 06/17/2007  . BLURRED VISION 06/17/2007  . HYPERTENSION 06/17/2007  . GERD 06/17/2007  . DYSPEPSIA 06/17/2007  . ARTHRALGIA, SHOULDER 06/17/2007  . POSTLAMINECTOMY SYNDROME THORACIC REGION 06/17/2007  . CERVICALGIA 06/17/2007  . BACK PAIN, LUMBAR, WITH RADICULOPATHY 06/17/2007  . CHEST WALL PAIN, ANTERIOR 06/17/2007    [facility] ,MS, CCC-SLP  07/21/2016, 11:26 AM  [facility] [address] [place], [place], [address] Phone: [phone]   Fax:  [phone]   Name: Debbie Mccarthy MRN: [id number] Date of Birth: 06/13/1945

## 2016-07-24 ENCOUNTER — Ambulatory Visit: Payer: Medicare HMO

## 2016-07-24 DIAGNOSIS — R498 Other voice and resonance disorders: Secondary | ICD-10-CM

## 2016-07-24 NOTE — Therapy (Signed)
[facility] [address] [place], [place], [address] Phone: [phone]   Fax:  [phone]  Speech Language Pathology Treatment  Patient Details  Name: Debbie Mccarthy MRN: [id number] Date of Birth: Nov 22, 1945 Referring Provider: Dr. Kalman ShanMurali Ramaswamy  Encounter Date: 07/24/2016    Past Medical History:  Diagnosis Date  . Diabetes (HCC)   . Hypercholesterolemia   . Hypertension   . Kidney stones     Past Surgical History:  Procedure Laterality Date  . BACK SURGERY    . NASAL SINUS SURGERY    . TUBAL LIGATION      There were no vitals filed for this visit.      Subjective Assessment - 07/24/16 1034    Subjective Pt reports coughing has decr'd "a little" over the weekend.               ADULT SLP TREATMENT - 07/24/16 1035      General Information   Behavior/Cognition Alert;Cooperative;Pleasant mood     Treatment Provided   Treatment provided Cognitive-Linquistic     Cognitive-Linquistic Treatment   Treatment focused on Voice   Skilled Treatment SLP targeted symptoms of vocal cord dysfunction (VCD). Pt with noted persistent vocal fry even after SLP worked with pt to reduce vocal strain in speech. SLP used vocal relaxsation of vaying pitch range, use of intraoral space with extended "home". Pt with persistent vocal fry at and of utterance despite what was tried by SLP. Pt's abdominal breathing was challenging at first, with min A req'd occasionally but faded to SBA after 4 minute practice in supine. Pt's practice with AB in sitting was not as successful as in supine.      Assessment / Recommendations / Plan   Plan Continue with current plan of care     Progression Toward Goals   Progression toward goals Progressing toward goals          SLP Education - 07/24/16 1257    Education provided Yes   Education Details vocal fry vs. properly supported voice   Person(s) Educated Patient   Methods  Explanation;Demonstration;Verbal cues   Comprehension Verbalized understanding;Need further instruction;Verbal cues required          SLP Short Term Goals - 07/24/16 1259      SLP SHORT TERM GOAL #1   Title Pt will converse for 10 minutes using abdominal breathing with occasional min A   Time 3   Period Weeks   Status On-going     SLP SHORT TERM GOAL #2   Title Pt will report carryover of relaxation techniques and pursed lip breathing 2x outside of therapy over 3 sessions   Time 3   Period Weeks   Status On-going     SLP SHORT TERM GOAL #3   Title Pt will report decreased frequency of VCD symptoms outside of therapy by 25% subjectively   Time 3   Period Weeks   Status On-going          SLP Long Term Goals - 07/24/16 1259      SLP LONG TERM GOAL #1   Title Pt will report compensatory strategies of pursed sniff breathing and relaxation outside of therapy over 6 sessions   Time 7   Period Weeks   Status On-going     SLP LONG TERM GOAL #2   Title Pt will converse for 15 minutes with abdominal breathing with rare min A   Time 7   Period Weeks  Status On-going     SLP LONG TERM GOAL #3   Title Pt will report decreased frequency of VCD symptoms outside of therapy by 50%   Time 7   Period Weeks   Status On-going          Plan - 07/24/16 1258    Clinical Impression Statement Strained voice and speaking on residual volume persist. One throat clear today. SLP educated/re-educated pt about abdominal breathing (AB) and introduced pt to vocal strain caused by vocal fry. Cont'd ST necessary for improving pt's ability to communicate effectively without throat clearing/coughing.      Patient will benefit from skilled therapeutic intervention in order to improve the following deficits and impairments:   Other voice and resonance disorders    Problem List Patient Active Problem List   Diagnosis Date Noted  . Chronic cough 06/02/2016  . Irritable larynx syndrome  06/02/2016  . CARDIOMYOPATHY 06/19/2007  . OSTEOPENIA 06/19/2007  . DYSPNEA ON EXERTION 06/19/2007  . HYPERLIPIDEMIA 06/17/2007  . BLURRED VISION 06/17/2007  . HYPERTENSION 06/17/2007  . GERD 06/17/2007  . DYSPEPSIA 06/17/2007  . ARTHRALGIA, SHOULDER 06/17/2007  . POSTLAMINECTOMY SYNDROME THORACIC REGION 06/17/2007  . CERVICALGIA 06/17/2007  . BACK PAIN, LUMBAR, WITH RADICULOPATHY 06/17/2007  . CHEST WALL PAIN, ANTERIOR 06/17/2007    [facility] ,MS, CCC-SLP  07/24/2016, 1:00 PM  [facility] [address] [place], [place], [address] Phone: [phone]   Fax:  [phone]   Name: Debbie Mccarthy MRN: [id number] Date of Birth: 12-Mar-1945

## 2016-07-24 NOTE — Patient Instructions (Signed)
You MUST practice abdominal breathing for at least 10 minutes, twice each day.

## 2016-07-27 ENCOUNTER — Ambulatory Visit: Payer: Medicare HMO

## 2016-07-27 DIAGNOSIS — R498 Other voice and resonance disorders: Secondary | ICD-10-CM

## 2016-07-27 NOTE — Patient Instructions (Signed)
  Practice your relaxed good belly breathing for 5-10 minutes each day.  Then, use "oo" vowel with "m" and "n" for use with your real voice. No froggy voice!!

## 2016-07-27 NOTE — Therapy (Signed)
[facility] [address] [place], [place], [address] Phone: [phone]   Fax:  [phone]  Speech Language Pathology Treatment  Patient Details  Name: Debbie Mccarthy MRN: [id number] Date of Birth: Nov 13, 1945 Referring Provider: Dr. Kalman ShanMurali Ramaswamy  Encounter Date: 07/27/2016      End of Session - 07/27/16 1138    Visit Number 4   Number of Visits 17   Date for SLP Re-Evaluation 08/25/16   SLP Start Time 1021   SLP Stop Time  1103   SLP Time Calculation (min) 42 min      Past Medical History:  Diagnosis Date  . Diabetes (HCC)   . Hypercholesterolemia   . Hypertension   . Kidney stones     Past Surgical History:  Procedure Laterality Date  . BACK SURGERY    . NASAL SINUS SURGERY    . TUBAL LIGATION      There were no vitals filed for this visit.      Subjective Assessment - 07/27/16 1030    Subjective "I coughed all night long night before last, and then coughed some yesterday."   Currently in Pain? No/denies               ADULT SLP TREATMENT - 07/27/16 1032      General Information   Behavior/Cognition Alert;Cooperative;Pleasant mood     Treatment Provided   Treatment provided Cognitive-Linquistic     Cognitive-Linquistic Treatment   Treatment focused on Voice   Skilled Treatment SLP targeted symptoms of vocal cord dysfunction (VCD), vocal strain/tightness and use of vocal fry. SLP worked extensively with vocal relaxation initially and then worked with reduction of vocal fry with both negative practice and ID'ing SLP "froggy voice" vs. "real voice". Vocal relaxsation of vaying pitch range with nasal consonants (moon, new, moo, noon). Pt with persistent vocal fry at beginning of session and this was reduced by session end, but still evident. Pt's abdominal breathing req'd min A rarely initially and faded to SBA in supine.      Assessment / Recommendations / Plan   Plan Continue with  current plan of care     Progression Toward Goals   Progression toward goals Progressing toward goals            SLP Short Term Goals - 07/27/16 1159      SLP SHORT TERM GOAL #1   Title Pt will converse for 10 minutes using abdominal breathing with occasional min A   Time 3   Period Weeks   Status On-going     SLP SHORT TERM GOAL #2   Title Pt will report carryover of relaxation techniques and pursed lip breathing 2x outside of therapy over 3 sessions   Time 3   Period Weeks   Status On-going     SLP SHORT TERM GOAL #3   Title Pt will report decreased frequency of VCD symptoms outside of therapy by 25% subjectively   Time 3   Period Weeks   Status On-going          SLP Long Term Goals - 07/27/16 1200      SLP LONG TERM GOAL #1   Title Pt will report compensatory strategies of pursed sniff breathing and relaxation outside of therapy over 6 sessions   Time 7   Period Weeks   Status On-going     SLP LONG TERM GOAL #2   Title Pt will converse for 15 minutes with abdominal breathing with  rare min A   Time 7   Period Weeks   Status On-going     SLP LONG TERM GOAL #3   Title Pt will report decreased frequency of VCD symptoms outside of therapy by 50%   Time 7   Period Weeks   Status On-going          Plan - 07/27/16 1138    Clinical Impression Statement Strained voice initially today, but after intense working on vocal fry, frequency decr'd. Five throat clears today. Cont'd ST necessary for improving pt's ability to communicate effectively with relaxed voice, proper breath support, and without throat clearing/coughing.      Patient will benefit from skilled therapeutic intervention in order to improve the following deficits and impairments:   Other voice and resonance disorders    Problem List Patient Active Problem List   Diagnosis Date Noted  . Chronic cough 06/02/2016  . Irritable larynx syndrome 06/02/2016  . CARDIOMYOPATHY 06/19/2007  .  OSTEOPENIA 06/19/2007  . DYSPNEA ON EXERTION 06/19/2007  . HYPERLIPIDEMIA 06/17/2007  . BLURRED VISION 06/17/2007  . HYPERTENSION 06/17/2007  . GERD 06/17/2007  . DYSPEPSIA 06/17/2007  . ARTHRALGIA, SHOULDER 06/17/2007  . POSTLAMINECTOMY SYNDROME THORACIC REGION 06/17/2007  . CERVICALGIA 06/17/2007  . BACK PAIN, LUMBAR, WITH RADICULOPATHY 06/17/2007  . CHEST WALL PAIN, ANTERIOR 06/17/2007    [facility] ,MS, CCC-SLP  07/27/2016, 12:00 PM  [facility] [address] [place], [place], [address] Phone: [phone]   Fax:  [phone]   Name: Debbie Mccarthy MRN: [id number] Date of Birth: 12-Oct-1945

## 2016-08-01 ENCOUNTER — Ambulatory Visit: Payer: Medicare HMO | Admitting: Speech Pathology

## 2016-08-01 DIAGNOSIS — R498 Other voice and resonance disorders: Secondary | ICD-10-CM | POA: Diagnosis not present

## 2016-08-01 NOTE — Patient Instructions (Signed)
  [facility]  Consider calling Ramaswammy and try gabapentin  Keep focusing on breath support while you are talking - it does help your voice quality to breathe to avoid vocal fry  Use confidential, quiet relaxed voice when you are talking   Try sniff and blow when you feel coughing come on to pretreat coughing episode  Re-read through all of the materials we have provided every few weeks to help recall/regroup and continue to work on your voice   Keep reducing throat clearing  Yawn/sigh  Vocal rest 3 days  - try  Keep abdominal breathing - sniff blow and relaxation

## 2016-08-01 NOTE — Therapy (Signed)
[facility] [address] [place], [place], [address] Phone: [phone]   Fax:  [phone]  Speech Language Pathology Treatment  Patient Details  Name: Debbie Mccarthy MRN: [id number] Date of Birth: [birth year] Referring Provider: Dr. Brand Males  Encounter Date: 08/01/2016      End of Session - 08/01/16 1502    Visit Number 5   Number of Visits 17   Date for SLP Re-Evaluation 08/25/16   Authorization Type [employer] MCR - auth required $45.00 co pay - submitted to [facility] Start Time 1017   SLP Stop Time  1050   SLP Time Calculation (min) 33 min   Activity Tolerance Patient tolerated treatment well      Past Medical History:  Diagnosis Date  . Diabetes ([place])   . Hypercholesterolemia   . Hypertension   . Kidney stones     Past Surgical History:  Procedure Laterality Date  . BACK SURGERY    . NASAL SINUS SURGERY    . TUBAL LIGATION      There were no vitals filed for this visit.             ADULT SLP TREATMENT - 08/01/16 1052      General Information   Behavior/Cognition Alert;Cooperative;Pleasant mood     Treatment Provided   Treatment provided Cognitive-Linquistic     Cognitive-Linquistic Treatment   Treatment focused on Voice   Skilled Treatment Pt frustrated with ongoing coughing at night. She states she follows reflux precautions and continues to take reflux meds. She required occasional verbal cues for breath support for conversation as vocal fry noted as breath runs out. Pt with exagerrated gasp/inhalation at the end of long utterances when she did not breath frerquently to support her speech. She required cues for throat clear altneratives and cues to ID when she clears her throat (3x this session) Continued training for laryngeal relaxation including yawn/sigh.      Assessment / Recommendations / Plan   Plan Discharge SLP treatment due to (comment)  pt request     Progression  Toward Goals   Progression toward goals Progressing toward goals          SLP Education - 08/01/16 1059    Education provided Yes   Education Details continue to practice breath support for speech and sniff/blow with laryngeal relaxation to reduce VCD symptoms   Person(s) Educated Patient   Methods Explanation;Demonstration;Verbal cues;Handout   Comprehension Verbalized understanding;Returned demonstration;Verbal cues required     SPEECH THERAPY DISCHARGE SUMMARY  Visits from Start of Care: 5  Current functional level related to goals / functional outcomes: See goals below   Remaining deficits: VCD, greater at night   Education / Equipment: Abdominal breathing, sniff-blow breathing, relaxation, compensations for VCD, breath support for speech Plan: Patient agrees to discharge.  Patient goals were not met. Patient is being discharged due to financial reasons.  ?????           SLP Short Term Goals - 08/01/16 1501      SLP SHORT TERM GOAL #1   Title Pt will converse for 10 minutes using abdominal breathing with occasional min A   Time 3   Period Weeks   Status Not Met     SLP SHORT TERM GOAL #2   Title Pt will report carryover of relaxation techniques and pursed lip breathing 2x outside of therapy over 3 sessions   Time 3   Period Weeks   Status  Not Met     SLP SHORT TERM GOAL #3   Title Pt will report decreased frequency of VCD symptoms outside of therapy by 25% subjectively   Time 3   Period Weeks   Status Not Met          SLP Long Term Goals - 08/01/16 1501      SLP LONG TERM GOAL #1   Title Pt will report compensatory strategies of pursed sniff breathing and relaxation outside of therapy over 6 sessions   Time 7   Period Weeks   Status Not Met     SLP LONG TERM GOAL #2   Title Pt will converse for 15 minutes with abdominal breathing with rare min A   Time 7   Period Weeks   Status Not Met     SLP LONG TERM GOAL #3   Title Pt will report  decreased frequency of VCD symptoms outside of therapy by 50%   Time 7   Period Weeks   Status Not Met          Plan - 08/01/16 1059    Clinical Impression Statement Pt entered ST room frustrated with ongoing coughing at night. She agrees her voice quality has improved and is improved with proper breath support. She states that she has had some success using relaxed sniff-blow to reduce VCD onset/duration during the day. I educated pt that her VCD has been long lasting, and it will take longer than 3 weeks to habitualize breathing strategies. Pt is requesting to be d/c'd from ST to due $45 coopay. I encouraged her to continue practicing strategies learned in ST for improved voice qualtity and to reduce VCD symptoms. Consider E[facility] evaluation at [facility] is voice quality remains strained.    Speech Therapy Frequency 2x / week   Treatment/Interventions Cueing hierarchy;Functional tasks;Patient/family education;Internal/external aids;SLP instruction and feedback;Compensatory techniques;Environmental controls      Patient will benefit from skilled therapeutic intervention in order to improve the following deficits and impairments:   Other voice and resonance disorders    Problem List Patient Active Problem List   Diagnosis Date Noted  . Chronic cough 06/02/2016  . Irritable larynx syndrome 06/02/2016  . CARDIOMYOPATHY 06/19/2007  . OSTEOPENIA 06/19/2007  . DYSPNEA ON EXERTION 06/19/2007  . HYPERLIPIDEMIA 06/17/2007  . BLURRED VISION 06/17/2007  . HYPERTENSION 06/17/2007  . GERD 06/17/2007  . DYSPEPSIA 06/17/2007  . ARTHRALGIA, SHOULDER 06/17/2007  . POSTLAMINECTOMY SYNDROME THORACIC REGION 06/17/2007  . CERVICALGIA 06/17/2007  . BACK PAIN, LUMBAR, WITH RADICULOPATHY 06/17/2007  . CHEST WALL PAIN, ANTERIOR 06/17/2007    Lovvorn, Annye Rusk MS, CCC-SLP 08/01/2016, 3:02 PM  [facility] [address] [place], [place],  [address] Phone: [phone]   Fax:  [phone]   Name: Debbie Mccarthy MRN: [id number] Date of Birth: 12/14/1945

## 2016-08-02 ENCOUNTER — Telehealth: Payer: Self-pay | Admitting: Adult Health

## 2016-08-02 NOTE — Telephone Encounter (Signed)
After speaking with patient-states [facility] called today with research information. States program requires her to be off certain meds for 12 weeks. Pt aware that this should wait until MR comes back next week to address concerns with patient and research trial.

## 2016-08-02 NOTE — Telephone Encounter (Signed)
Pt returning call and can be reached @ [phone].Caren GriffinsStanley A Dalton

## 2016-08-02 NOTE — Telephone Encounter (Signed)
Spoke with pt, states that her chronic cough is not at all improved since her last 2 office visits.  States this is continual through day and night.  Pt states that it was previously mentioned by our providers that she could try taking gabapentin to help suppress cough.  Pt would like to start this tx.   Pt uses [facility] in [place].  Sending to DOD as both MR and TP are off this week.  JN please advise.  Thanks!

## 2016-08-02 NOTE — Telephone Encounter (Signed)
ATC pt on mobile number- received busy dial. lmtcb x1 on home number on file.

## 2016-08-02 NOTE — Telephone Encounter (Signed)
I do not see where Gabapentin is mentioned int he notes. He did mention a referral to a "cough trial" when he last saw her in May. Perhaps Dr. Marchelle Gearingamaswamy can address this when he returns from his vacation? When is he back?

## 2016-08-03 ENCOUNTER — Encounter: Payer: Medicare HMO | Admitting: Speech Pathology

## 2016-08-07 DIAGNOSIS — M25511 Pain in right shoulder: Secondary | ICD-10-CM | POA: Diagnosis not present

## 2016-08-08 ENCOUNTER — Encounter: Payer: Medicare HMO | Admitting: Speech Pathology

## 2016-08-08 MED ORDER — GABAPENTIN 300 MG PO CAPS: ORAL_CAPSULE | ORAL | 5 refills | Status: AC

## 2016-08-08 NOTE — Telephone Encounter (Signed)
lmtcb x1 for pt. 

## 2016-08-08 NOTE — Telephone Encounter (Signed)
Called and spoke to pt. Informed her of the recs per MR. Rx sent to preferred pharmacy. Pt verbalized understanding and denied any further questions or concerns at this time.   

## 2016-08-08 NOTE — Telephone Encounter (Signed)
[phone] Patient is returning phone call.

## 2016-08-08 NOTE — Telephone Encounter (Signed)
D/w [facility] - Orma FlamingLauren Mccarthy - patient not  Interested right now in cough trial  Re: gabapentin - I did mentioin in my notes but she said did not work in past. If she is willing to try again that is fine. Let her know side effects include grogginess so we will go slow and possible interactions with cymbalta which she is on but we can keep an eye on  Take gabapentin 300mg  once daily x 5 days, then 300mg  twice daily x 5 days, then 300mg  three times daily to continue. If this makes you too sleepy or drowsy call us and we will cut your medication dosing down  Keep fu for august 2018  Dr. Kalman ShanMurali Jakye Mullens, M.D., [facility].P Pulmonary and Critical Care Medicine Staff Physician [facility]  Pulmonary and Critical Care Pager: [phone], If no answer or between  15:00h - 7:00h: call [phone]  08/08/2016 8:20 AM

## 2016-08-10 ENCOUNTER — Encounter: Payer: Medicare HMO | Admitting: Speech Pathology

## 2016-08-15 ENCOUNTER — Encounter: Payer: Medicare HMO | Admitting: Speech Pathology

## 2016-08-17 ENCOUNTER — Encounter: Payer: Medicare HMO | Admitting: Speech Pathology

## 2016-08-29 ENCOUNTER — Encounter: Payer: Medicare HMO | Admitting: Speech Pathology

## 2016-08-31 ENCOUNTER — Encounter: Payer: Medicare HMO | Admitting: Speech Pathology

## 2016-09-04 ENCOUNTER — Encounter: Payer: Medicare HMO | Admitting: Speech Pathology

## 2016-09-05 ENCOUNTER — Ambulatory Visit (INDEPENDENT_AMBULATORY_CARE_PROVIDER_SITE_OTHER): Payer: Medicare HMO | Admitting: Internal Medicine

## 2016-09-05 ENCOUNTER — Encounter: Payer: Self-pay | Admitting: Internal Medicine

## 2016-09-05 VITALS — BP 152/78 | HR 78 | Ht 64.0 in | Wt 176.6 lb

## 2016-09-05 DIAGNOSIS — R05 Cough: Secondary | ICD-10-CM | POA: Diagnosis not present

## 2016-09-05 DIAGNOSIS — R053 Chronic cough: Secondary | ICD-10-CM

## 2016-09-05 DIAGNOSIS — R220 Localized swelling, mass and lump, head: Secondary | ICD-10-CM | POA: Diagnosis not present

## 2016-09-05 DIAGNOSIS — J387 Other diseases of larynx: Secondary | ICD-10-CM

## 2016-09-05 NOTE — Addendum Note (Signed)
Addended by: Sheran Luz on: 09/05/2016 10:00 AM   Modules accepted: Orders

## 2016-09-05 NOTE — Progress Notes (Signed)
Subjective:     Patient ID: Debbie Mccarthy, female   DOB: 13-May-1945, 71 y.o.   MRN: [id number]  HPI  IOV 06/02/2016  Chief Complaint  Patient presents with  . Pulmonary Consult    pt reports coughing for 3 years but hydrocodone was given for a broken shoulder and masked the cough for a while but she is off hydrocodone and now chronic persistent cough    71 year old female. 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 She is the daughter of Buel Ream a patient who I took care of for chronic cough a few years ago. According to the patient she's had insidious onset of chronic cough since December 2014 when she suffered a respiratory viral illness with the cough never really resolved. Since then the cough is persistent. It is moderate to severe. It is present throughout the day but can wake up in the middle of the night. She feels most of the cough is coming from her throat. She complains of extreme associated scratchiness of the throat is without a bunch of feathers scraping her throat. She has to clear her throat all the time. There is no associated wheezing or dyspnea. The cough is dry in quality. The cough can be made worse by talking which is part of her profession. It is relieved a little bit by rest. Lozenges can occasionally cooler down. She is not on an ACE inhibitor. She had sinus surgery at age 74. She has acid reflux and is being treated for that. Is no prior history of asthma COPD or pulmonary fibrosis.  Noted: cough always improved with hydrocodone igven for shourlder surgery 3 years ago an 1 year ago. PRior gbapentin did not help  feno 17 ppb and normal     06/19/2016 Follow up ;: Chronic cough  Patient returns for a two-week follow-up. Patient was seen last visit for pulmonary consult for chronic cough that started in December 2014 after respiratory illness. Patient is a never smoker. Cough is waxed and waned has some sensation of throat irritation with frequent  throat clearing. Cough is mainly dry. She had use hydrocodone with some relief in the past. Had been tried on gabapentin but did not see any improvement. Patient was set up for CT sinus that was negative for acute or chronic sinus disease. A high resolution CT chest was negative for interstitial lung disease. Nothing to explain etiology for cough.. She had no history of methotrexate, amiodarone, Macrodantin or ACE inhibitor use. Patient was recommended to begin on Flonase. And PPI daily  Since last visit. Patient is feeling no change . Still has cough . Was seen in ER last week after severe cough with pulled rib muscle. XRay neg.  Has been to [facility] and tried gabapentin has some help in past.   Spirometry today shows moderate restriction ( has rib binder on) FEV1 63%, ratio 80, FVC 60%.   We discussed research studies for chronic cough . She is interested to hear about study .      OV 09/05/2016  Chief Complaint  Patient presents with  . Follow-up    Pt states her cough is unchanged. Pt c/o worsening cough x 2 days. Pt states she is now having chest congestion with yellow mucus. Pt c/o PND and sinus congestion. Pt denies CP/tightness and f/c/s.     71 year old female with chronic refractory cough. She has now been on gabapentin but with no relief. RSI cough score is 28. We again  discussed interventions in the past. She has been to Barnie Alderman at [facility] few years ago. She recollects him telling that she has noisy breathing even at rest. This summer she has been to voice rehabilitation at least 7 times according to her history but without any relief. She is extremely frustrated. We discussed options of referral to [facility] or a [facility] [employer] for chronic cough versus participation in research trials. She is worried about the concept of getting placebo in research trials but I had to educate her about research being voluntary and educate her about therapeutic  misconception and t range of untreatable outcomes with research was positive and negative bein g higer. After that she is interested in research trial participation    Dr Gretta Cool Reflux Symptom Index (> 13-15 suggestive of LPR cough) 0 -> 5  =  none ->severe problem 06/02/2016  09/05/2016 With gabapentin  Hoarseness of problem with voice 3 3  Clearing  Of Throat 4 4  Excess throat mucus or feeling of post nasal drip 3 3  Difficulty swallowing food, liquid or tablets 2 2  Cough after eating or lying down 3 4  Breathing difficulties or choking episodes 3 1  Troublesome or annoying cough 5 5  Sensation of something sticking in throat or lump in throat 5 5  Heartburn, chest pain, indigestion, or stomach acid coming up 4 1  TOTAL 31 28        has a past medical history of Diabetes ([facility]); Hypercholesterolemia; Hypertension; and Kidney stones.   reports that she has never smoked. She has never used smokeless tobacco.  Past Surgical History:  Procedure Laterality Date  . BACK SURGERY    . NASAL SINUS SURGERY    . TUBAL LIGATION      Allergies  Allergen Reactions  . Morphine And Related Rash    Immunization History  Administered Date(s) Administered  . Influenza, High Dose Seasonal PF 09/17/2015    Family History  Problem Relation Age of Onset  . Emphysema Father   . Asthma Father   . Stroke Father      Current Outpatient Prescriptions:  .  cyclobenzaprine (FLEXERIL) 5 MG tablet, Take 1 tablet (5 mg total) by mouth 3 (three) times daily as needed for muscle spasms., Disp: 20 tablet, Rfl: 0 .  DULoxetine (CYMBALTA) 60 MG capsule, Take 60 mg by mouth daily., Disp: , Rfl:  .  fluticasone (FLONASE) 50 MCG/ACT nasal spray, Place 2 sprays into both nostrils daily., Disp: 16 g, Rfl: 2 .  gabapentin (NEURONTIN) 300 MG capsule, 300mg  once daily x 5 days, then 300mg  twice daily x 5 days, then 300mg  three times daily to continue, Disp: 90 capsule, Rfl: 5 .  Levothyroxine Sodium  (SYNTHROID PO), Take by mouth daily., Disp: , Rfl:  .  metFORMIN (GLUCOPHAGE) 500 MG tablet, Take by mouth daily with breakfast. , Disp: , Rfl:  .  omeprazole (PRILOSEC) 20 MG capsule, Take 20 mg by mouth daily., Disp: , Rfl:  .  pravastatin (PRAVACHOL) 20 MG tablet, Take 20 mg by mouth daily., Disp: , Rfl:  .  benzonatate (TESSALON) 200 MG capsule, Take 1 capsule (200 mg total) by mouth 3 (three) times daily as needed for cough. (Patient not taking: Reported on 09/05/2016), Disp: 45 capsule, Rfl: 3   Review of Systems     Objective:   Physical Exam  Constitutional: She is oriented to person, place, and time. She appears well-developed and well-nourished. No distress.  HENT:  Head: Normocephalic and atraumatic.  Right Ear: External ear normal.  Left Ear: External ear normal.  Mouth/Throat: Oropharynx is clear and moist. No oropharyngeal exudate.  Noisy upper airway breathing even when quiet Laryngeal cough + hoasse voice Bony bump on hard palate +  Eyes: Pupils are equal, round, and reactive to light. Conjunctivae and EOM are normal. Right eye exhibits no discharge. Left eye exhibits no discharge. No scleral icterus.  Neck: Normal range of motion. Neck supple. No JVD present. No tracheal deviation present. No thyromegaly present.  Cardiovascular: Normal rate, regular rhythm, normal heart sounds and intact distal pulses.  Exam reveals no gallop and no friction rub.   No murmur heard. Pulmonary/Chest: Effort normal and breath sounds normal. No respiratory distress. She has no wheezes. She has no rales. She exhibits no tenderness.  Abdominal: Soft. Bowel sounds are normal. She exhibits no distension and no mass. There is no tenderness. There is no rebound and no guarding.  Musculoskeletal: Normal range of motion. She exhibits no edema or tenderness.  Lymphadenopathy:    She has no cervical adenopathy.  Neurological: She is alert and oriented to person, place, and time. She has normal  reflexes. No cranial nerve deficit. She exhibits normal muscle tone. Coordination normal.  Skin: Skin is warm and dry. No rash noted. She is not diaphoretic. No erythema. No pallor.  Psychiatric: She has a normal mood and affect. Her behavior is normal. Judgment and thought content normal.  Vitals reviewed.  Vitals:   09/05/16 0930  BP: (!) 152/78  Pulse: 78  SpO2: 96%    There is no height or weight on file to calculate BMI.     Assessment:       ICD-10-CM   1. Chronic cough R05   2. Irritable larynx syndrome J38.7   3. Palate mass R22.0        Plan:      - Your chronic refractory cough otherwise called cough neuropathy - No treatment interventions have worked - We discussed research trial participation and we will refer you to [facility] a research department  - You have agreed that research is voluntary, and there is chance that you will get placebo  - Slowly come off gabapentin as follows   - 300 mg twice daily for 1 week  Then 300 mg once daily for 1 week  Then 300 mg Monday Wednesday Friday for 1 week  Then stop  -   Palate mass  - I think this is the bony lump but we will refer you to ENT   Follow-up  - 3-6 months or sooner if needed   > 50% of this > 25 min visit spent in face to face counseling or coordination of care    Dr. Kalman Shan, M.D., [facility].C.P Pulmonary and Critical Care Medicine Staff Physician [facility] Pulmonary and Critical Care Pager: [phone], If no answer or between  15:00h - 7:00h: call [phone]  09/05/2016 9:57 AM

## 2016-09-05 NOTE — Patient Instructions (Addendum)
Chronic cough Irritable larynx syndrome  - Your chronic refractory cough otherwise called cough neuropathy - No treatment interventions have worked - We discussed research trial participation and we will refer you to [facility] a research department  - You have agreed that research is voluntary, and there is chance that you will get placebo  - Slowly come off gabapentin as follows   - 300 mg twice daily for 1 week  Then 300 mg once daily for 1 week  Then 300 mg Monday Wednesday Friday for 1 week  Then stop  -   Palate mass  - I think this is the bony lump but we will refer you to ENT   Follow-up  - 3-6 months or sooner if needed

## 2016-09-21 DIAGNOSIS — R05 Cough: Secondary | ICD-10-CM | POA: Diagnosis not present

## 2016-09-21 DIAGNOSIS — K1379 Other lesions of oral mucosa: Secondary | ICD-10-CM | POA: Diagnosis not present

## 2016-10-23 DIAGNOSIS — E1140 Type 2 diabetes mellitus with diabetic neuropathy, unspecified: Secondary | ICD-10-CM | POA: Diagnosis not present

## 2016-10-23 DIAGNOSIS — I10 Essential (primary) hypertension: Secondary | ICD-10-CM | POA: Diagnosis not present

## 2016-10-23 DIAGNOSIS — I700 Atherosclerosis of aorta: Secondary | ICD-10-CM | POA: Diagnosis not present

## 2016-10-23 DIAGNOSIS — E7800 Pure hypercholesterolemia, unspecified: Secondary | ICD-10-CM | POA: Diagnosis not present

## 2016-10-23 DIAGNOSIS — Z23 Encounter for immunization: Secondary | ICD-10-CM | POA: Diagnosis not present

## 2016-11-21 DIAGNOSIS — H8113 Benign paroxysmal vertigo, bilateral: Secondary | ICD-10-CM | POA: Diagnosis not present

## 2016-11-21 DIAGNOSIS — R42 Dizziness and giddiness: Secondary | ICD-10-CM | POA: Diagnosis not present

## 2016-11-27 DIAGNOSIS — R262 Difficulty in walking, not elsewhere classified: Secondary | ICD-10-CM | POA: Diagnosis not present

## 2016-11-29 DIAGNOSIS — R262 Difficulty in walking, not elsewhere classified: Secondary | ICD-10-CM | POA: Diagnosis not present

## 2016-11-29 DIAGNOSIS — H8111 Benign paroxysmal vertigo, right ear: Secondary | ICD-10-CM | POA: Diagnosis not present

## 2016-12-01 DIAGNOSIS — H8111 Benign paroxysmal vertigo, right ear: Secondary | ICD-10-CM | POA: Diagnosis not present

## 2016-12-01 DIAGNOSIS — R262 Difficulty in walking, not elsewhere classified: Secondary | ICD-10-CM | POA: Diagnosis not present

## 2016-12-04 DIAGNOSIS — H8111 Benign paroxysmal vertigo, right ear: Secondary | ICD-10-CM | POA: Diagnosis not present

## 2016-12-04 DIAGNOSIS — R262 Difficulty in walking, not elsewhere classified: Secondary | ICD-10-CM | POA: Diagnosis not present

## 2016-12-06 DIAGNOSIS — R262 Difficulty in walking, not elsewhere classified: Secondary | ICD-10-CM | POA: Diagnosis not present

## 2016-12-06 DIAGNOSIS — H8111 Benign paroxysmal vertigo, right ear: Secondary | ICD-10-CM | POA: Diagnosis not present

## 2016-12-20 DIAGNOSIS — H8111 Benign paroxysmal vertigo, right ear: Secondary | ICD-10-CM | POA: Diagnosis not present

## 2016-12-20 DIAGNOSIS — M6281 Muscle weakness (generalized): Secondary | ICD-10-CM | POA: Diagnosis not present

## 2017-02-04 ENCOUNTER — Emergency Department (INDEPENDENT_AMBULATORY_CARE_PROVIDER_SITE_OTHER)
Admission: EM | Admit: 2017-02-04 | Discharge: 2017-02-04 | Disposition: A | Discharge status: Home / Self Care | Payer: Medicare HMO | Source: Home / Self Care | Attending: Family Medicine | Admitting: Family Medicine

## 2017-02-04 ENCOUNTER — Encounter: Payer: Self-pay | Admitting: Emergency Medicine

## 2017-02-04 ENCOUNTER — Other Ambulatory Visit: Payer: Self-pay

## 2017-02-04 DIAGNOSIS — R42 Dizziness and giddiness: Secondary | ICD-10-CM

## 2017-02-04 MED ORDER — MECLIZINE HCL 12.5 MG PO TABS: 12.5000 mg | ORAL_TABLET | Freq: Three times a day (TID) | ORAL | 1 refills | Status: AC | PRN

## 2017-02-04 NOTE — ED Triage Notes (Signed)
Has been treated for positional vertigo in 12/18 but it cleared. This morning she awoke with severe dizziness in all positions and nausea.

## 2017-02-04 NOTE — Discharge Instructions (Signed)
Increase fluid intake. °If symptoms become significantly worse during the night or over the weekend, proceed to the local emergency room.  °

## 2017-02-04 NOTE — ED Provider Notes (Signed)
[facility]    CSN: [id number] Arrival date & time: 02/04/17  1356     History   Chief Complaint Chief Complaint  Patient presents with  . Dizziness  . Nausea    HPI Debbie Mccarthy is a 72 y.o. female.   Patient reports that she turned over in bed this morning and suddenly felt dizzy (spinning sensation).  She has had persistent symptoms with movement.  She has had nausea without vomiting.  No other neuro symptoms.  She states that she just finished therapy one month ago for benign positional vertigo.   The history is provided by the patient and the spouse.  Dizziness  Quality:  Head spinning, room spinning and imbalance Severity:  Mild Onset quality:  Sudden Duration:  7 hours Timing:  Intermittent Progression:  Unchanged Chronicity:  Recurrent Context: bending over, head movement, physical activity and standing up   Context: not with loss of consciousness   Relieved by:  Being still Worsened by:  Movement Ineffective treatments:  None tried Associated symptoms: nausea and vomiting   Associated symptoms: no chest pain, no diarrhea, no headaches, no hearing loss, no palpitations, no shortness of breath, no syncope, no tinnitus, no vision changes and no weakness     Past Medical History:  Diagnosis Date  . Diabetes (HCC)   . Hypercholesterolemia   . Hypertension   . Kidney stones     Patient Active Problem List   Diagnosis Date Noted  . Palate mass 09/05/2016  . Chronic cough 06/02/2016  . Irritable larynx syndrome 06/02/2016  . CARDIOMYOPATHY 06/19/2007  . OSTEOPENIA 06/19/2007  . DYSPNEA ON EXERTION 06/19/2007  . HYPERLIPIDEMIA 06/17/2007  . BLURRED VISION 06/17/2007  . HYPERTENSION 06/17/2007  . GERD 06/17/2007  . DYSPEPSIA 06/17/2007  . ARTHRALGIA, SHOULDER 06/17/2007  . POSTLAMINECTOMY SYNDROME THORACIC REGION 06/17/2007  . CERVICALGIA 06/17/2007  . BACK PAIN, LUMBAR, WITH RADICULOPATHY 06/17/2007  . CHEST WALL PAIN, ANTERIOR  06/17/2007    Past Surgical History:  Procedure Laterality Date  . BACK SURGERY    . NASAL SINUS SURGERY    . TUBAL LIGATION      OB History    No data available       Home Medications    Prior to Admission medications   Medication Sig Start Date End Date Taking? Authorizing Provider  atenolol (TENORMIN) 25 MG tablet Take by mouth daily.   Yes [provider]  DULoxetine (CYMBALTA) 60 MG capsule Take 60 mg by mouth daily.    [provider]  gabapentin (NEURONTIN) 300 MG capsule 300mg  once daily x 5 days, then 300mg  twice daily x 5 days, then 300mg  three times daily to continue 08/08/16   Kalman Shan, MD  Levothyroxine Sodium (SYNTHROID PO) Take by mouth daily.    [provider]  meclizine (ANTIVERT) 12.5 MG tablet Take 1 tablet (12.5 mg total) by mouth 3 (three) times daily as needed for dizziness. 02/04/17   Lattie Haw, MD  metFORMIN (GLUCOPHAGE) 500 MG tablet Take by mouth daily with breakfast.     [provider]  omeprazole (PRILOSEC) 20 MG capsule Take 20 mg by mouth daily.    [provider]  pravastatin (PRAVACHOL) 20 MG tablet Take 20 mg by mouth daily.    [provider]    Family History Family History  Problem Relation Age of Onset  . Emphysema Father   . Asthma Father   . Stroke Father     Social History  Social History   Tobacco Use  . Smoking status: Never Smoker  . Smokeless tobacco: Never Used  Substance Use Topics  . Alcohol use: Yes    Alcohol/week: 1.2 oz    Types: 2 Glasses of wine per week  . Drug use: Not on file     Allergies   Morphine and related   Review of Systems Review of Systems  HENT: Negative for hearing loss and tinnitus.   Respiratory: Negative for shortness of breath.   Cardiovascular: Negative for chest pain, palpitations and syncope.  Gastrointestinal: Positive for nausea and vomiting. Negative for diarrhea.  Neurological: Positive for dizziness. Negative  for weakness and headaches.  All other systems reviewed and are negative.    Physical Exam Triage Vital Signs ED Triage Vitals [02/04/17 1456]  Enc Vitals Group     BP (!) 185/72     Pulse Rate (!) 57     Resp 16     Temp (!) 97.3 F (36.3 C)     Temp Source Oral     SpO2 98 %     Weight      Height      Head Circumference      Peak Flow      Pain Score      Pain Loc      Pain Edu?      Excl. in GC?    No data found.  Updated Vital Signs BP (!) 185/72 (BP Location: Right Arm)   Pulse (!) 57   Temp (!) 97.3 F (36.3 C) (Oral)   Resp 16   SpO2 98%   Visual Acuity Right Eye Distance:   Left Eye Distance:   Bilateral Distance:    Right Eye Near:   Left Eye Near:    Bilateral Near:     Physical Exam Nursing notes and Vital Signs reviewed. Appearance:  Patient appears stated age, and in no acute distress Eyes:  Pupils are equal, round, and reactive to light and accomodation.  Extraocular movement is intact.  Conjunctivae are not inflamed.  Fundi benign.  Slight nystagmus present.  Ears:  Canals normal.  Tympanic membranes normal.  Nose:  Mildly congested turbinates.  No sinus tenderness present. Pharynx:  Normal Neck:  Supple.  No adenopathy. Lungs:  Clear to auscultation.  Breath sounds are equal.  Moving air well. Heart:  Regular rate and rhythm without murmurs, rubs, or gallops.  Abdomen:  Nontender without masses or hepatosplenomegaly.  Bowel sounds are present.  No CVA or flank tenderness.  Extremities:  No edema.  Skin:  No rash present.   Neurologic:  Cranial nerves 2 through 12 are normal.  Patellar, achilles, and elbow reflexes are normal.  Cerebellar function is intact (finger-to-nose and rapid alternating hand movement).  Gait and station are normal.    UC Treatments / Results  Labs (all labs ordered are listed, but only abnormal results are displayed) Labs Reviewed - No data to display  EKG  EKG Interpretation None       Radiology No  results found.  Procedures Procedures (including critical care time)  Medications Ordered in UC Medications - No data to display   Initial Impression / Assessment and Plan / UC Course  I have reviewed the triage vital signs and the nursing notes.  Pertinent labs & imaging results that were available during my care of the patient were reviewed by me and considered in my medical decision making (see chart for details).  Begin Antivert. Increase fluid intake. If symptoms become significantly worse during the night or over the weekend, proceed to the local emergency room.  Followup with ENT as soon as possible for follow-up.    Final Clinical Impressions(s) / UC Diagnoses   Final diagnoses:  Vertigo    ED Discharge Orders        Ordered    meclizine (ANTIVERT) 12.5 MG tablet  3 times daily PRN     02/04/17 1553           Lattie HawBeese, Kaiyan Luczak A, MD 02/09/17 2029

## 2017-02-07 ENCOUNTER — Telehealth: Payer: Self-pay | Admitting: *Deleted

## 2017-02-07 NOTE — Telephone Encounter (Signed)
Spoke to pt she reports that she is reports that she is feeling much better since her visit. She had a f/u appt with her PCP and they are scheduling her an appt for an epleys maneuver. She will call back if she has any questions or concerns.

## 2017-03-08 DIAGNOSIS — I10 Essential (primary) hypertension: Secondary | ICD-10-CM | POA: Diagnosis not present

## 2017-03-08 DIAGNOSIS — E1140 Type 2 diabetes mellitus with diabetic neuropathy, unspecified: Secondary | ICD-10-CM | POA: Diagnosis not present

## 2017-03-08 DIAGNOSIS — E7800 Pure hypercholesterolemia, unspecified: Secondary | ICD-10-CM | POA: Diagnosis not present

## 2017-03-08 DIAGNOSIS — Z01419 Encounter for gynecological examination (general) (routine) without abnormal findings: Secondary | ICD-10-CM | POA: Diagnosis not present

## 2017-03-08 DIAGNOSIS — Z0000 Encounter for general adult medical examination without abnormal findings: Secondary | ICD-10-CM | POA: Diagnosis not present

## 2017-03-08 DIAGNOSIS — E039 Hypothyroidism, unspecified: Secondary | ICD-10-CM | POA: Diagnosis not present

## 2017-03-08 DIAGNOSIS — Z9189 Other specified personal risk factors, not elsewhere classified: Secondary | ICD-10-CM | POA: Diagnosis not present

## 2017-03-08 DIAGNOSIS — Z1231 Encounter for screening mammogram for malignant neoplasm of breast: Secondary | ICD-10-CM | POA: Diagnosis not present

## 2017-03-08 DIAGNOSIS — M810 Age-related osteoporosis without current pathological fracture: Secondary | ICD-10-CM | POA: Diagnosis not present

## 2017-03-08 DIAGNOSIS — Z1289 Encounter for screening for malignant neoplasm of other sites: Secondary | ICD-10-CM | POA: Diagnosis not present

## 2017-03-08 DIAGNOSIS — R5383 Other fatigue: Secondary | ICD-10-CM | POA: Diagnosis not present

## 2017-03-08 DIAGNOSIS — M899 Disorder of bone, unspecified: Secondary | ICD-10-CM | POA: Diagnosis not present

## 2017-03-19 ENCOUNTER — Other Ambulatory Visit: Payer: Self-pay | Admitting: Unknown Physician Specialty

## 2017-03-19 DIAGNOSIS — Z1231 Encounter for screening mammogram for malignant neoplasm of breast: Secondary | ICD-10-CM

## 2017-03-19 DIAGNOSIS — Z780 Asymptomatic menopausal state: Secondary | ICD-10-CM

## 2017-03-20 DIAGNOSIS — L723 Sebaceous cyst: Secondary | ICD-10-CM | POA: Diagnosis not present

## 2017-03-28 ENCOUNTER — Ambulatory Visit (INDEPENDENT_AMBULATORY_CARE_PROVIDER_SITE_OTHER): Payer: Medicare HMO

## 2017-03-28 ENCOUNTER — Other Ambulatory Visit: Payer: Medicare HMO

## 2017-03-28 DIAGNOSIS — Z1231 Encounter for screening mammogram for malignant neoplasm of breast: Secondary | ICD-10-CM | POA: Diagnosis not present

## 2017-04-04 ENCOUNTER — Ambulatory Visit (INDEPENDENT_AMBULATORY_CARE_PROVIDER_SITE_OTHER): Payer: Medicare HMO

## 2017-04-04 DIAGNOSIS — Z780 Asymptomatic menopausal state: Secondary | ICD-10-CM | POA: Diagnosis not present

## 2017-04-04 DIAGNOSIS — M85852 Other specified disorders of bone density and structure, left thigh: Secondary | ICD-10-CM | POA: Diagnosis not present

## 2017-04-04 DIAGNOSIS — M8588 Other specified disorders of bone density and structure, other site: Secondary | ICD-10-CM | POA: Diagnosis not present

## 2017-04-04 DIAGNOSIS — M8589 Other specified disorders of bone density and structure, multiple sites: Secondary | ICD-10-CM | POA: Diagnosis not present

## 2017-05-18 DIAGNOSIS — N898 Other specified noninflammatory disorders of vagina: Secondary | ICD-10-CM | POA: Diagnosis not present

## 2017-05-18 DIAGNOSIS — R945 Abnormal results of liver function studies: Secondary | ICD-10-CM | POA: Diagnosis not present

## 2017-05-18 DIAGNOSIS — N949 Unspecified condition associated with female genital organs and menstrual cycle: Secondary | ICD-10-CM | POA: Diagnosis not present

## 2017-05-18 DIAGNOSIS — E871 Hypo-osmolality and hyponatremia: Secondary | ICD-10-CM | POA: Diagnosis not present

## 2017-05-18 DIAGNOSIS — K045 Chronic apical periodontitis: Secondary | ICD-10-CM | POA: Diagnosis not present

## 2017-05-18 DIAGNOSIS — Z792 Long term (current) use of antibiotics: Secondary | ICD-10-CM | POA: Diagnosis not present

## 2017-06-27 DIAGNOSIS — H524 Presbyopia: Secondary | ICD-10-CM | POA: Diagnosis not present

## 2017-06-27 DIAGNOSIS — E119 Type 2 diabetes mellitus without complications: Secondary | ICD-10-CM | POA: Diagnosis not present

## 2017-06-28 DIAGNOSIS — M899 Disorder of bone, unspecified: Secondary | ICD-10-CM | POA: Diagnosis not present

## 2017-06-28 DIAGNOSIS — I10 Essential (primary) hypertension: Secondary | ICD-10-CM | POA: Diagnosis not present

## 2017-06-28 DIAGNOSIS — E559 Vitamin D deficiency, unspecified: Secondary | ICD-10-CM | POA: Diagnosis not present

## 2017-06-28 DIAGNOSIS — E7800 Pure hypercholesterolemia, unspecified: Secondary | ICD-10-CM | POA: Diagnosis not present

## 2017-06-28 DIAGNOSIS — E1140 Type 2 diabetes mellitus with diabetic neuropathy, unspecified: Secondary | ICD-10-CM | POA: Diagnosis not present

## 2017-06-28 DIAGNOSIS — E039 Hypothyroidism, unspecified: Secondary | ICD-10-CM | POA: Diagnosis not present

## 2017-06-28 DIAGNOSIS — M810 Age-related osteoporosis without current pathological fracture: Secondary | ICD-10-CM | POA: Diagnosis not present

## 2017-10-04 DIAGNOSIS — Z23 Encounter for immunization: Secondary | ICD-10-CM | POA: Diagnosis not present

## 2017-11-08 DIAGNOSIS — H04123 Dry eye syndrome of bilateral lacrimal glands: Secondary | ICD-10-CM | POA: Diagnosis not present

## 2017-11-12 DIAGNOSIS — I700 Atherosclerosis of aorta: Secondary | ICD-10-CM | POA: Diagnosis not present

## 2017-11-12 DIAGNOSIS — I10 Essential (primary) hypertension: Secondary | ICD-10-CM | POA: Diagnosis not present

## 2017-11-12 DIAGNOSIS — E1169 Type 2 diabetes mellitus with other specified complication: Secondary | ICD-10-CM | POA: Diagnosis not present

## 2017-11-12 DIAGNOSIS — E039 Hypothyroidism, unspecified: Secondary | ICD-10-CM | POA: Diagnosis not present

## 2017-11-12 DIAGNOSIS — E7800 Pure hypercholesterolemia, unspecified: Secondary | ICD-10-CM | POA: Diagnosis not present

## 2017-11-12 DIAGNOSIS — E559 Vitamin D deficiency, unspecified: Secondary | ICD-10-CM | POA: Diagnosis not present

## 2017-11-12 DIAGNOSIS — M810 Age-related osteoporosis without current pathological fracture: Secondary | ICD-10-CM | POA: Diagnosis not present

## 2017-11-12 DIAGNOSIS — E1140 Type 2 diabetes mellitus with diabetic neuropathy, unspecified: Secondary | ICD-10-CM | POA: Diagnosis not present

## 2018-03-15 DIAGNOSIS — E559 Vitamin D deficiency, unspecified: Secondary | ICD-10-CM | POA: Diagnosis not present

## 2018-03-15 DIAGNOSIS — R5383 Other fatigue: Secondary | ICD-10-CM | POA: Diagnosis not present

## 2018-03-15 DIAGNOSIS — Z1231 Encounter for screening mammogram for malignant neoplasm of breast: Secondary | ICD-10-CM | POA: Diagnosis not present

## 2018-03-15 DIAGNOSIS — Z0000 Encounter for general adult medical examination without abnormal findings: Secondary | ICD-10-CM | POA: Diagnosis not present

## 2018-03-15 DIAGNOSIS — E1169 Type 2 diabetes mellitus with other specified complication: Secondary | ICD-10-CM | POA: Diagnosis not present

## 2018-03-15 DIAGNOSIS — E039 Hypothyroidism, unspecified: Secondary | ICD-10-CM | POA: Diagnosis not present

## 2018-03-15 DIAGNOSIS — M810 Age-related osteoporosis without current pathological fracture: Secondary | ICD-10-CM | POA: Diagnosis not present

## 2018-03-15 DIAGNOSIS — E7800 Pure hypercholesterolemia, unspecified: Secondary | ICD-10-CM | POA: Diagnosis not present

## 2018-03-15 DIAGNOSIS — E1140 Type 2 diabetes mellitus with diabetic neuropathy, unspecified: Secondary | ICD-10-CM | POA: Diagnosis not present

## 2018-03-15 DIAGNOSIS — M899 Disorder of bone, unspecified: Secondary | ICD-10-CM | POA: Diagnosis not present

## 2018-03-15 DIAGNOSIS — I10 Essential (primary) hypertension: Secondary | ICD-10-CM | POA: Diagnosis not present

## 2018-03-21 ENCOUNTER — Other Ambulatory Visit: Payer: Self-pay | Admitting: Unknown Physician Specialty

## 2018-03-21 DIAGNOSIS — Z780 Asymptomatic menopausal state: Secondary | ICD-10-CM

## 2018-03-21 DIAGNOSIS — Z1239 Encounter for other screening for malignant neoplasm of breast: Secondary | ICD-10-CM

## 2018-04-03 ENCOUNTER — Other Ambulatory Visit: Payer: Self-pay

## 2018-04-03 ENCOUNTER — Ambulatory Visit (INDEPENDENT_AMBULATORY_CARE_PROVIDER_SITE_OTHER): Payer: Medicare HMO

## 2018-04-03 DIAGNOSIS — Z1231 Encounter for screening mammogram for malignant neoplasm of breast: Secondary | ICD-10-CM | POA: Diagnosis not present

## 2018-04-03 DIAGNOSIS — Z1239 Encounter for other screening for malignant neoplasm of breast: Secondary | ICD-10-CM

## 2018-07-10 IMAGING — CT CT CHEST HIGH RESOLUTION W/O CM
2 of 6 series · 15 of 36 positions shown, 18 images · non-contrast
Comparison: None.

CLINICAL DATA: Chronic cough since 4289. No reported history of
smoking.

EXAM:
CT CHEST WITHOUT CONTRAST
TECHNIQUE: Multidetector CT imaging of the chest was performed following the
standard protocol without intravenous contrast. High resolution
imaging of the lungs, as well as inspiratory and expiratory imaging,
was performed.

[Series 2: high resolution (person_name) · axial · 0.64mm/px · z∈[+1152,+1382]mm · 12 of 129 slices shown, 15 images]
[im 7/129  mediastinal]
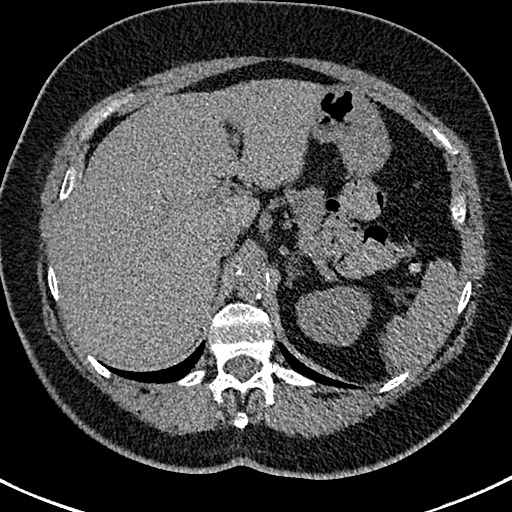
[im 7/129  lung]
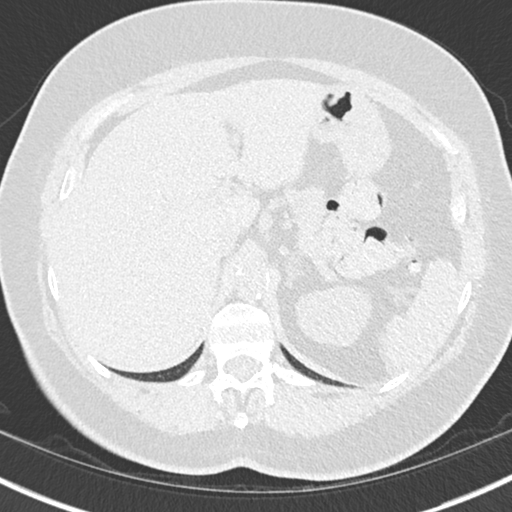
[im 21/129  lung]
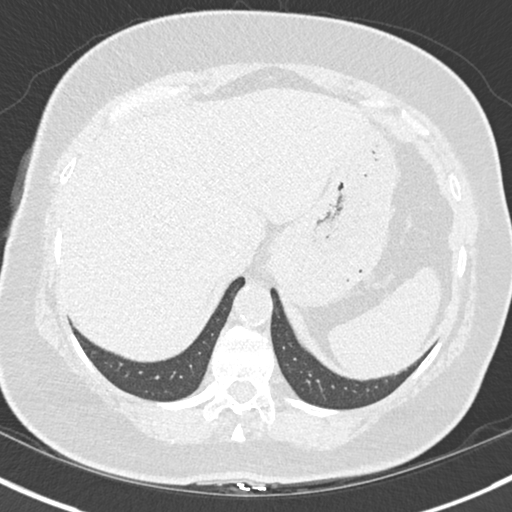
[im 27/129  lung]
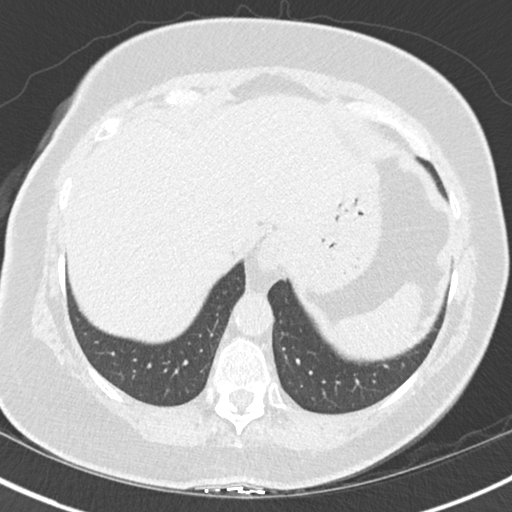
[im 41/129  lung]
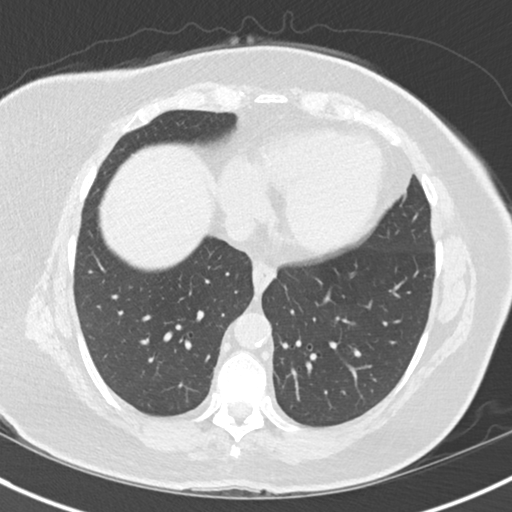
[im 48/129  mediastinal]
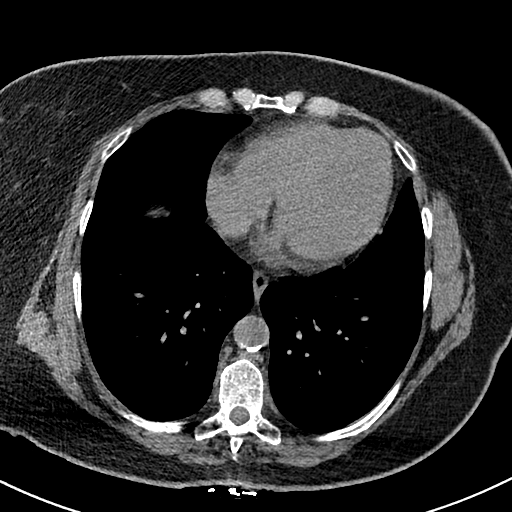
[im 48/129  lung]
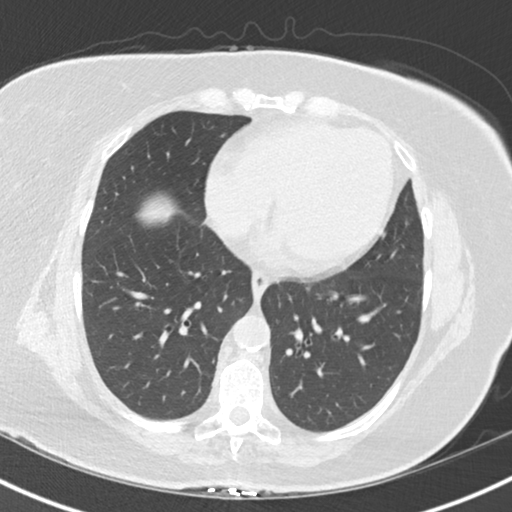
[im 61/129  lung]
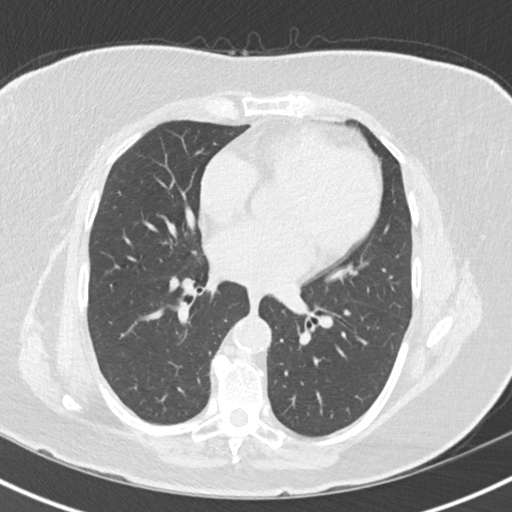
[im 68/129  lung]
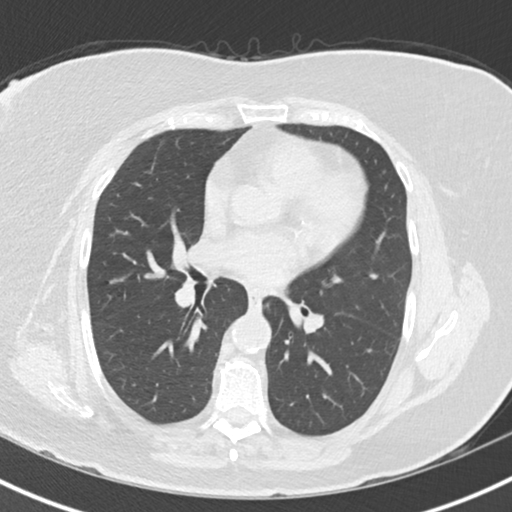
[im 81/129  lung]
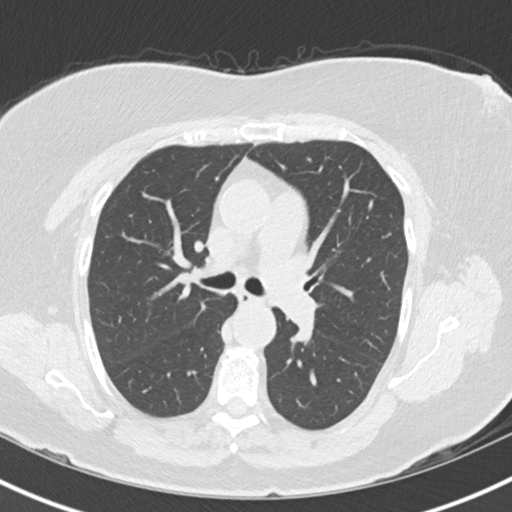
[im 88/129  mediastinal]
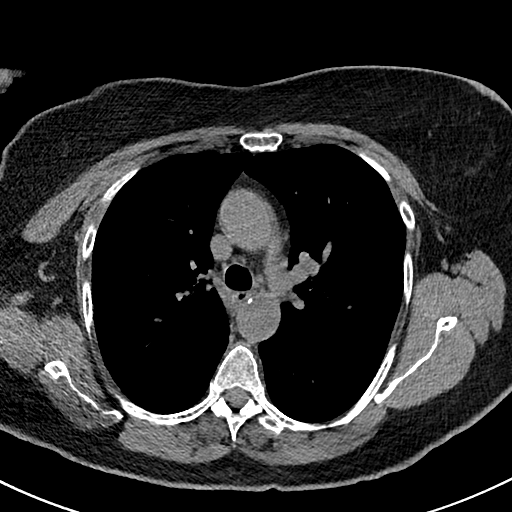
[im 88/129  lung]
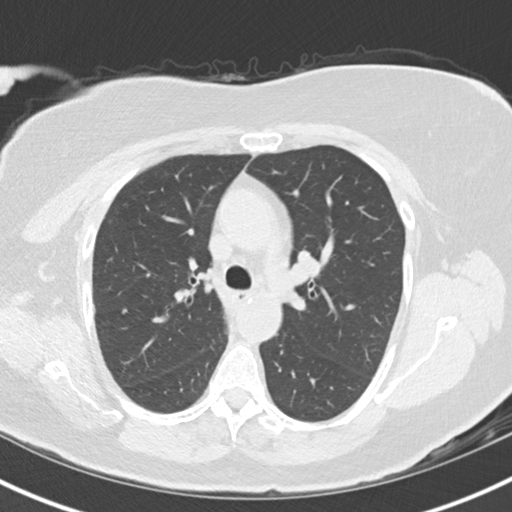
[im 102/129  lung]
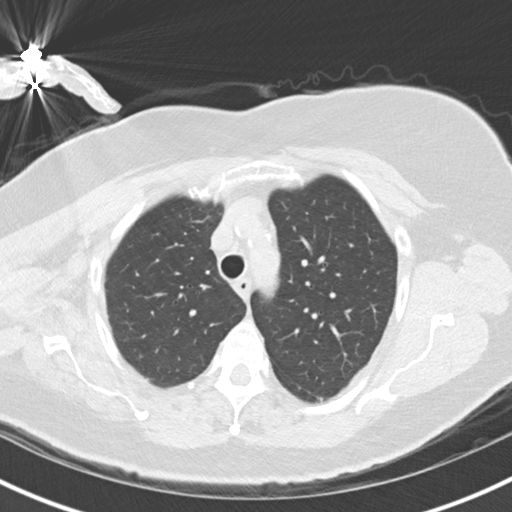
[im 108/129  lung]
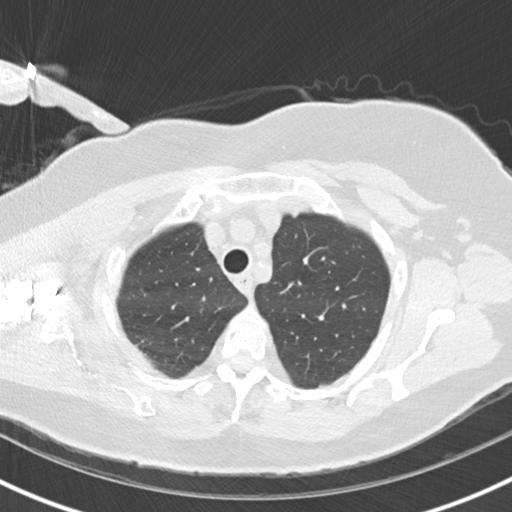
[im 122/129  lung]
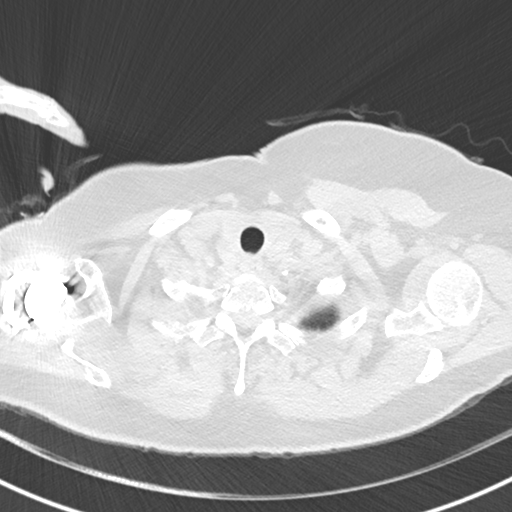

[Series 5: coronal · coronal · 0.54mm/px · 3 of 136 slices shown]
[im 28/136  lung]
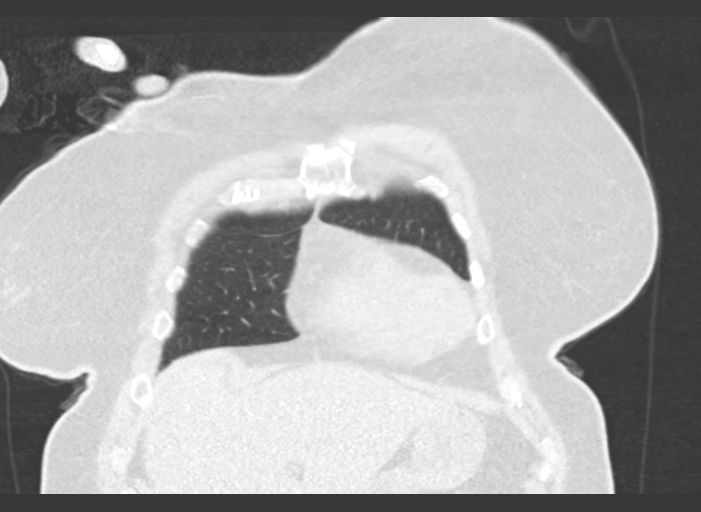
[im 55/136  lung]
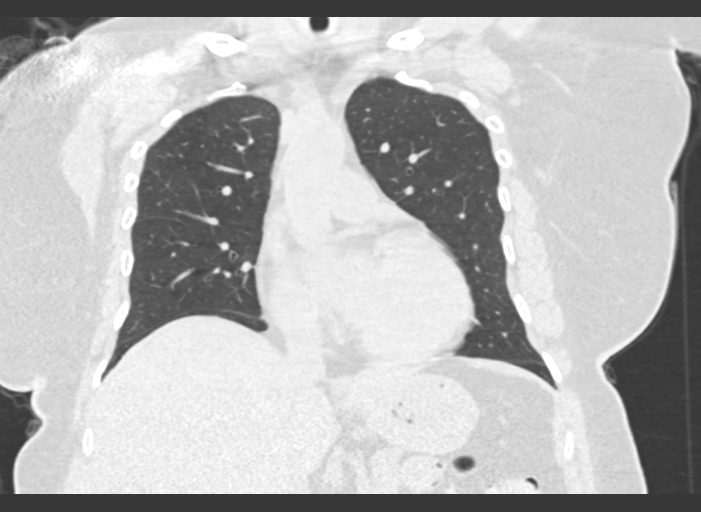
[im 82/136  lung]
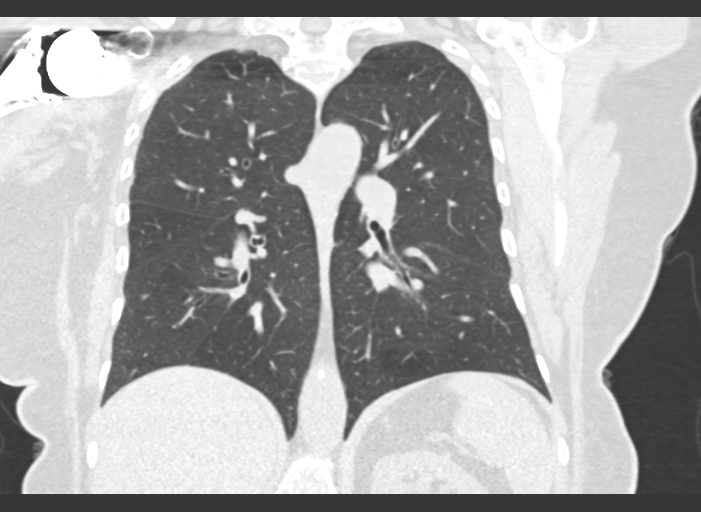

[15 of 36 positions shown; findings below may reference images not displayed]

FINDINGS: Cardiovascular: Normal heart size. No significant pericardial
fluid/thickening. Left anterior descending, left circumflex and
right coronary atherosclerosis. Atherosclerotic nonaneurysmal
thoracic aorta. Normal caliber pulmonary arteries.

Mediastinum/Nodes: No discrete thyroid nodules. Unremarkable
esophagus. No pathologically enlarged axillary, mediastinal or gross
hilar lymph nodes, noting limited sensitivity for the detection of
hilar adenopathy on this noncontrast study.

Lungs/Pleura: No pneumothorax. No pleural effusion. No acute
consolidative airspace disease, lung masses or significant pulmonary
nodules. No significant regions of subpleural reticulation,
ground-glass attenuation, traction bronchiectasis, parenchymal
banding, architectural distortion or frank honeycombing. No
significant air trapping on the expiration sequence.

Upper abdomen: Small granulomatous calcification in the right liver
lobe. Mild diverticulosis in the visualized splenic flexure of the
colon.

Musculoskeletal: No aggressive appearing focal osseous lesions.
Moderate thoracic spondylosis. Right total shoulder arthroplasty.
IMPRESSION: 1. No evidence of interstitial lung disease. No active pulmonary
disease.
2. Aortic atherosclerosis.  Three-vessel coronary atherosclerosis.

## 2018-07-10 IMAGING — CT CT PARANASAL SINUSES LIMITED
3 of 6 series · 11 of 47 positions shown, 13 images · non-contrast
Comparison: None.

CLINICAL DATA: Chronic cough.  Previous sinus surgery.

EXAM:
CT PARANASAL SINUS LIMITED WITHOUT CONTRAST
TECHNIQUE: Non-contiguous multidetector CT images of the paranasal sinuses were
obtained in a single plane without contrast.

[Series 3: sinus 2.0 h30s · axial · 0.39mm/px · z∈[-128,-48]mm · 6 of 52 slices shown, 8 images]
[im 6/52  brain]
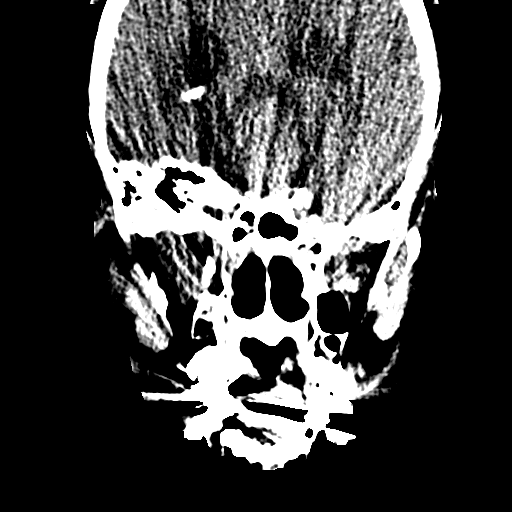
[im 6/52  bone]
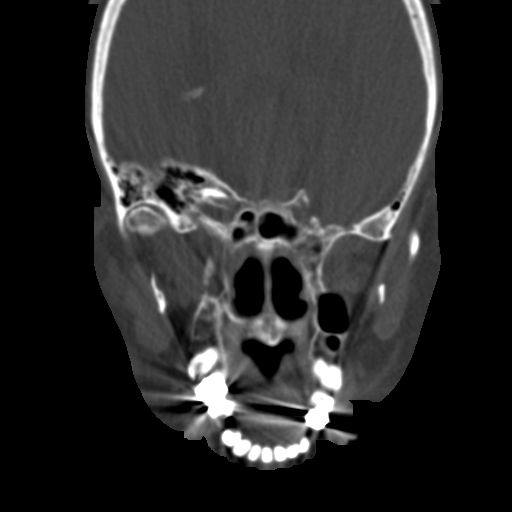
[im 14/52  bone]
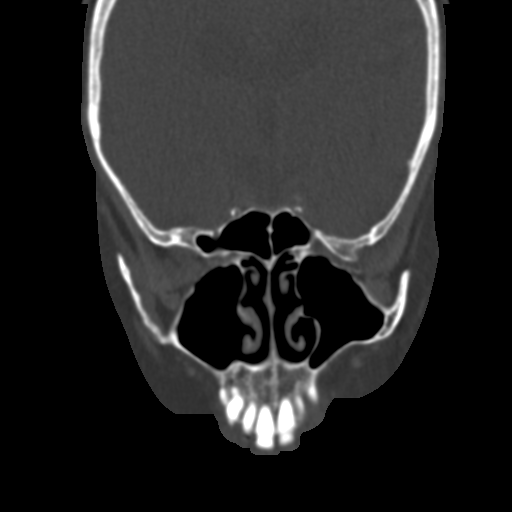
[im 22/52  bone]
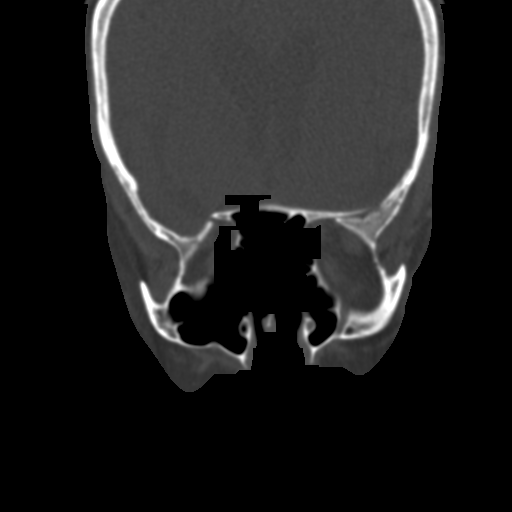
[im 30/52  bone]
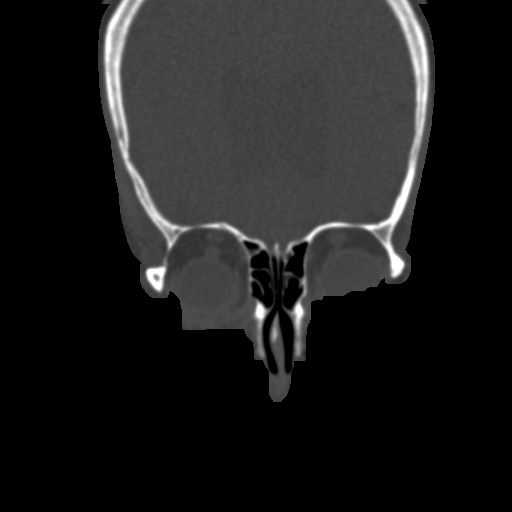
[im 38/52  brain]
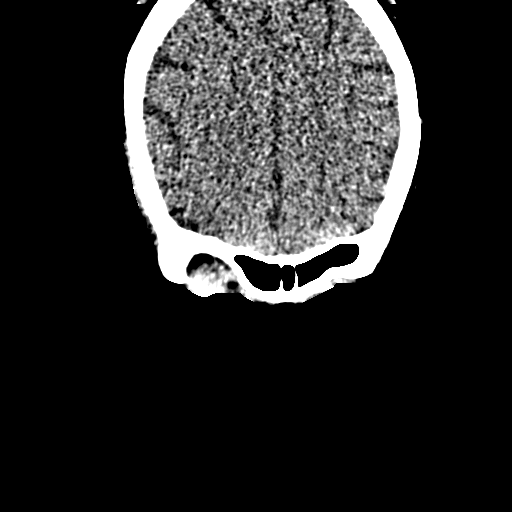
[im 38/52  bone]
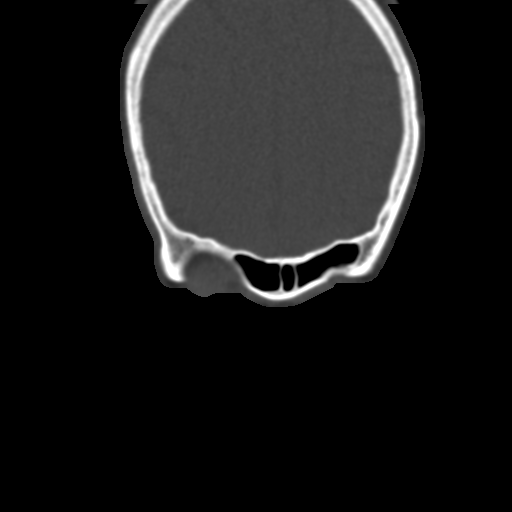
[im 46/52  bone]
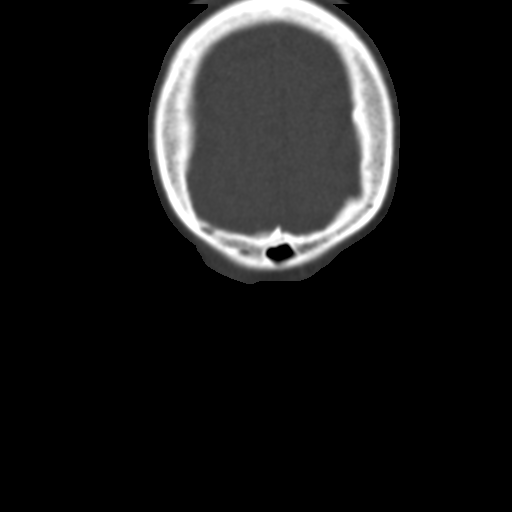

[Series 4: coronal bone · coronal · 0.20mm/px · 3 of 101 slices shown]
[im 26/101  bone]
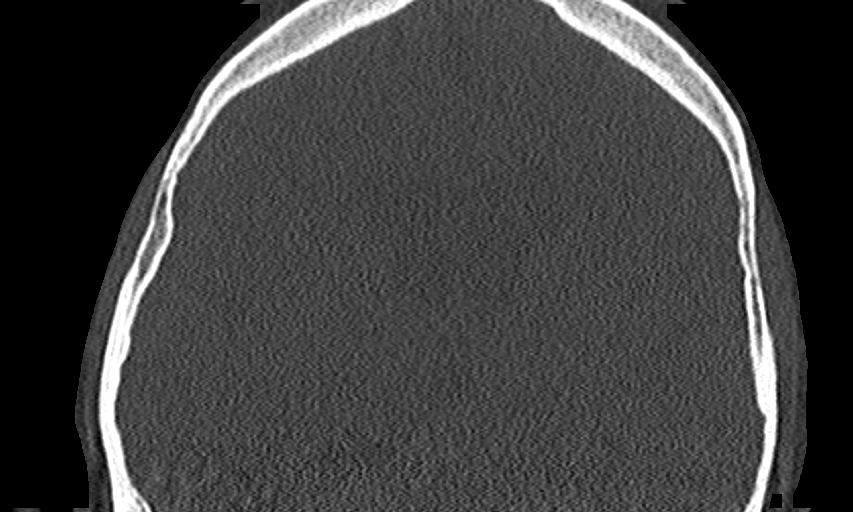
[im 51/101  bone]
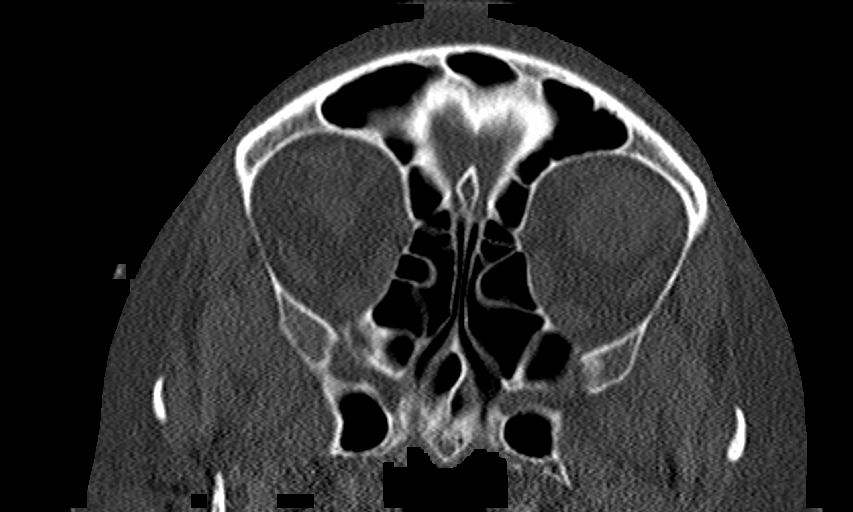
[im 76/101  bone]
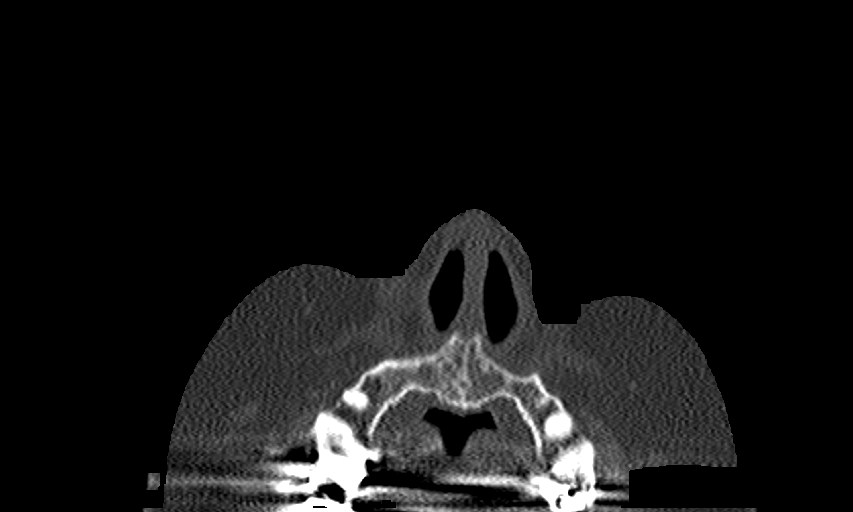

[Series 11: sagittal bone · sagittal · 0.13mm/px · 2 of 100 slices shown]
[im 34/100  bone]
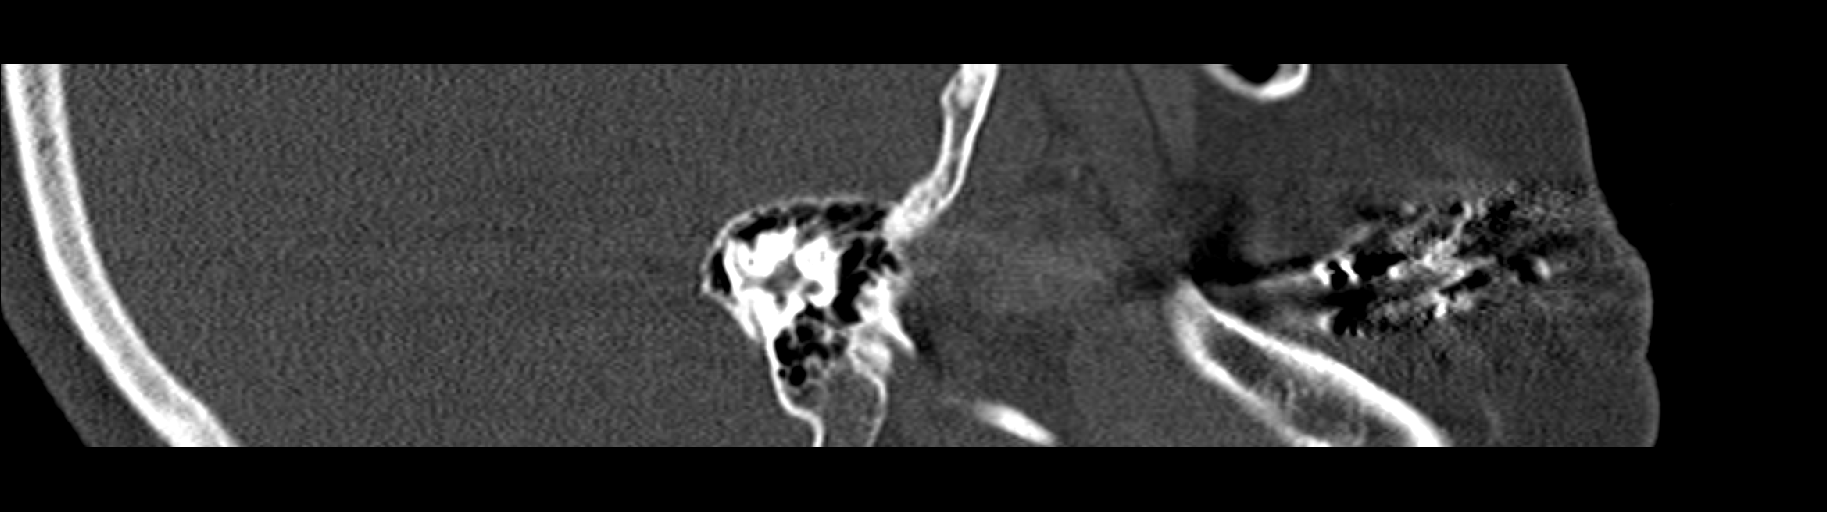
[im 67/100  bone]
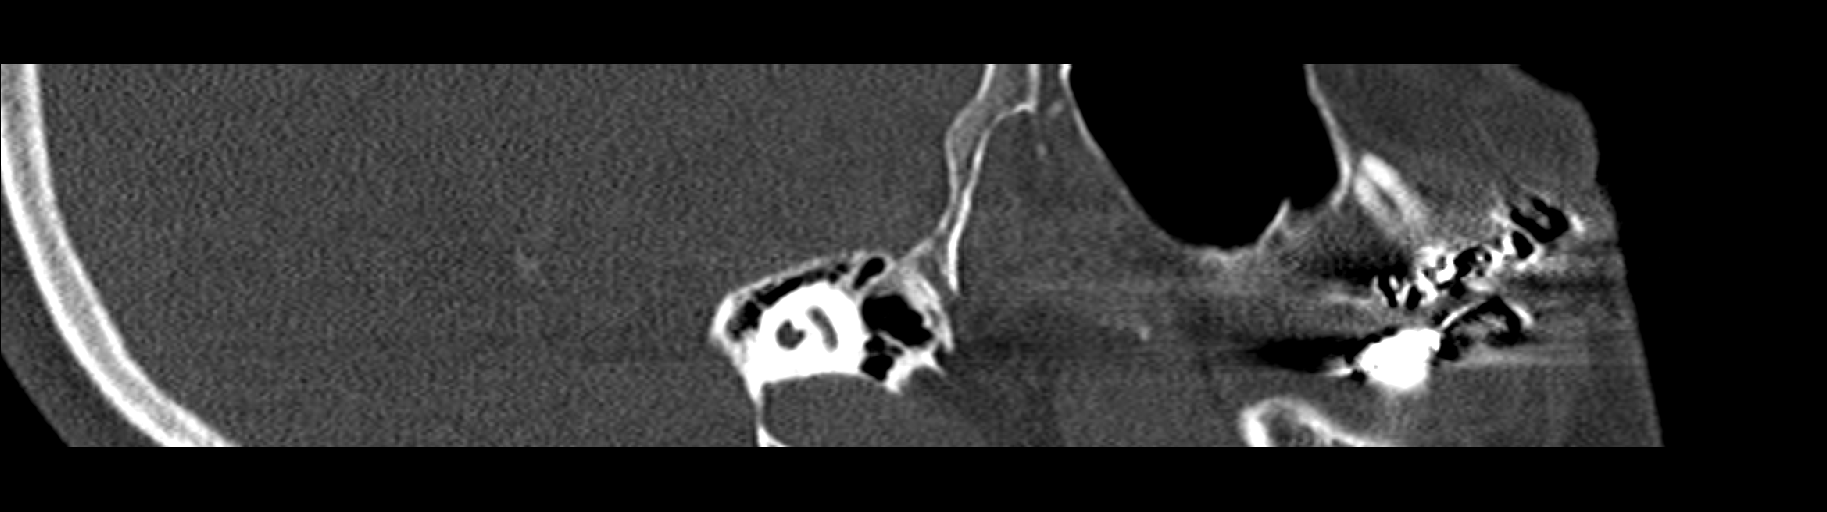

[11 of 47 positions shown; findings below may reference images not displayed]

FINDINGS: The paranasal sinuses and mastoid air cells are clear. There is no
significant mucosal thickening or fluid level.

Prominent ethmoid bulla are present bilaterally with medial
deviation of the ostiomeatal complex on both sides. There is no
obstruction. The nasal septum is midline. Nasal cavity is clear.
Previous inferior maxillary antrostomy is noted.

CT imaging the brain is unremarkable. Atherosclerotic calcifications
are present in the aorta without a hyperdense vessel.
IMPRESSION: 1. No evidence for acute or chronic sinus disease.
2. Prominent ethmoid bulla bilaterally without obstruction.
3. Atherosclerosis.

## 2018-07-11 DIAGNOSIS — E1169 Type 2 diabetes mellitus with other specified complication: Secondary | ICD-10-CM | POA: Diagnosis not present

## 2018-07-11 DIAGNOSIS — E7800 Pure hypercholesterolemia, unspecified: Secondary | ICD-10-CM | POA: Diagnosis not present

## 2018-07-11 DIAGNOSIS — I10 Essential (primary) hypertension: Secondary | ICD-10-CM | POA: Diagnosis not present

## 2018-07-11 DIAGNOSIS — E1140 Type 2 diabetes mellitus with diabetic neuropathy, unspecified: Secondary | ICD-10-CM | POA: Diagnosis not present

## 2018-07-11 DIAGNOSIS — I700 Atherosclerosis of aorta: Secondary | ICD-10-CM | POA: Diagnosis not present

## 2018-07-11 DIAGNOSIS — E039 Hypothyroidism, unspecified: Secondary | ICD-10-CM | POA: Diagnosis not present

## 2018-10-14 DIAGNOSIS — W57XXXA Bitten or stung by nonvenomous insect and other nonvenomous arthropods, initial encounter: Secondary | ICD-10-CM | POA: Diagnosis not present

## 2018-10-14 DIAGNOSIS — L299 Pruritus, unspecified: Secondary | ICD-10-CM | POA: Diagnosis not present

## 2018-10-14 DIAGNOSIS — L0390 Cellulitis, unspecified: Secondary | ICD-10-CM | POA: Diagnosis not present

## 2018-10-14 DIAGNOSIS — S61051A Open bite of right thumb without damage to nail, initial encounter: Secondary | ICD-10-CM | POA: Diagnosis not present

## 2018-10-25 DIAGNOSIS — E7800 Pure hypercholesterolemia, unspecified: Secondary | ICD-10-CM | POA: Diagnosis not present

## 2018-10-25 DIAGNOSIS — Z23 Encounter for immunization: Secondary | ICD-10-CM | POA: Diagnosis not present

## 2018-10-25 DIAGNOSIS — E039 Hypothyroidism, unspecified: Secondary | ICD-10-CM | POA: Diagnosis not present

## 2018-10-25 DIAGNOSIS — I10 Essential (primary) hypertension: Secondary | ICD-10-CM | POA: Diagnosis not present

## 2018-10-25 DIAGNOSIS — E1140 Type 2 diabetes mellitus with diabetic neuropathy, unspecified: Secondary | ICD-10-CM | POA: Diagnosis not present

## 2018-10-25 DIAGNOSIS — E1169 Type 2 diabetes mellitus with other specified complication: Secondary | ICD-10-CM | POA: Diagnosis not present

## 2018-11-12 DIAGNOSIS — H524 Presbyopia: Secondary | ICD-10-CM | POA: Diagnosis not present

## 2018-11-12 DIAGNOSIS — H04123 Dry eye syndrome of bilateral lacrimal glands: Secondary | ICD-10-CM | POA: Diagnosis not present

## 2018-11-12 DIAGNOSIS — E119 Type 2 diabetes mellitus without complications: Secondary | ICD-10-CM | POA: Diagnosis not present

## 2018-11-13 DIAGNOSIS — H524 Presbyopia: Secondary | ICD-10-CM | POA: Diagnosis not present

## 2018-11-13 DIAGNOSIS — H52209 Unspecified astigmatism, unspecified eye: Secondary | ICD-10-CM | POA: Diagnosis not present

## 2018-11-13 DIAGNOSIS — H5213 Myopia, bilateral: Secondary | ICD-10-CM | POA: Diagnosis not present

## 2018-12-19 DIAGNOSIS — M5416 Radiculopathy, lumbar region: Secondary | ICD-10-CM | POA: Diagnosis not present

## 2018-12-19 DIAGNOSIS — M545 Low back pain: Secondary | ICD-10-CM | POA: Diagnosis not present

## 2018-12-30 DIAGNOSIS — M5416 Radiculopathy, lumbar region: Secondary | ICD-10-CM | POA: Diagnosis not present

## 2019-01-23 DIAGNOSIS — M545 Low back pain: Secondary | ICD-10-CM | POA: Diagnosis not present

## 2019-01-23 DIAGNOSIS — M5416 Radiculopathy, lumbar region: Secondary | ICD-10-CM | POA: Diagnosis not present

## 2019-02-05 DIAGNOSIS — M5416 Radiculopathy, lumbar region: Secondary | ICD-10-CM | POA: Diagnosis not present

## 2019-02-17 DIAGNOSIS — E039 Hypothyroidism, unspecified: Secondary | ICD-10-CM | POA: Diagnosis not present

## 2019-02-17 DIAGNOSIS — Z01419 Encounter for gynecological examination (general) (routine) without abnormal findings: Secondary | ICD-10-CM | POA: Diagnosis not present

## 2019-02-17 DIAGNOSIS — M899 Disorder of bone, unspecified: Secondary | ICD-10-CM | POA: Diagnosis not present

## 2019-02-17 DIAGNOSIS — R87619 Unspecified abnormal cytological findings in specimens from cervix uteri: Secondary | ICD-10-CM | POA: Diagnosis not present

## 2019-02-17 DIAGNOSIS — I10 Essential (primary) hypertension: Secondary | ICD-10-CM | POA: Diagnosis not present

## 2019-02-17 DIAGNOSIS — Z1289 Encounter for screening for malignant neoplasm of other sites: Secondary | ICD-10-CM | POA: Diagnosis not present

## 2019-02-17 DIAGNOSIS — E7800 Pure hypercholesterolemia, unspecified: Secondary | ICD-10-CM | POA: Diagnosis not present

## 2019-02-17 DIAGNOSIS — E1140 Type 2 diabetes mellitus with diabetic neuropathy, unspecified: Secondary | ICD-10-CM | POA: Diagnosis not present

## 2019-02-17 DIAGNOSIS — E1169 Type 2 diabetes mellitus with other specified complication: Secondary | ICD-10-CM | POA: Diagnosis not present

## 2019-02-17 DIAGNOSIS — Z124 Encounter for screening for malignant neoplasm of cervix: Secondary | ICD-10-CM | POA: Diagnosis not present

## 2019-02-17 DIAGNOSIS — M810 Age-related osteoporosis without current pathological fracture: Secondary | ICD-10-CM | POA: Diagnosis not present

## 2019-02-17 DIAGNOSIS — Z0000 Encounter for general adult medical examination without abnormal findings: Secondary | ICD-10-CM | POA: Diagnosis not present

## 2019-02-17 DIAGNOSIS — R5383 Other fatigue: Secondary | ICD-10-CM | POA: Diagnosis not present

## 2019-02-17 DIAGNOSIS — Z9189 Other specified personal risk factors, not elsewhere classified: Secondary | ICD-10-CM | POA: Diagnosis not present

## 2019-02-27 DIAGNOSIS — M545 Low back pain: Secondary | ICD-10-CM | POA: Diagnosis not present

## 2019-03-05 DIAGNOSIS — M79672 Pain in left foot: Secondary | ICD-10-CM | POA: Diagnosis not present

## 2019-03-05 DIAGNOSIS — Z0189 Encounter for other specified special examinations: Secondary | ICD-10-CM | POA: Diagnosis not present

## 2019-03-07 DIAGNOSIS — M25511 Pain in right shoulder: Secondary | ICD-10-CM | POA: Diagnosis not present

## 2019-03-10 ENCOUNTER — Other Ambulatory Visit: Payer: Self-pay | Admitting: Unknown Physician Specialty

## 2019-03-10 DIAGNOSIS — Z1231 Encounter for screening mammogram for malignant neoplasm of breast: Secondary | ICD-10-CM

## 2019-03-19 DIAGNOSIS — M25511 Pain in right shoulder: Secondary | ICD-10-CM | POA: Diagnosis not present

## 2019-03-19 DIAGNOSIS — Z96611 Presence of right artificial shoulder joint: Secondary | ICD-10-CM | POA: Diagnosis not present

## 2019-04-23 ENCOUNTER — Ambulatory Visit (INDEPENDENT_AMBULATORY_CARE_PROVIDER_SITE_OTHER): Payer: Medicare HMO

## 2019-04-23 ENCOUNTER — Other Ambulatory Visit: Payer: Self-pay

## 2019-04-23 DIAGNOSIS — Z1231 Encounter for screening mammogram for malignant neoplasm of breast: Secondary | ICD-10-CM | POA: Diagnosis not present

## 2019-04-23 DIAGNOSIS — M79672 Pain in left foot: Secondary | ICD-10-CM | POA: Diagnosis not present

## 2019-04-23 DIAGNOSIS — Z780 Asymptomatic menopausal state: Secondary | ICD-10-CM

## 2019-04-23 DIAGNOSIS — M8589 Other specified disorders of bone density and structure, multiple sites: Secondary | ICD-10-CM | POA: Diagnosis not present

## 2019-04-30 DIAGNOSIS — M25511 Pain in right shoulder: Secondary | ICD-10-CM | POA: Diagnosis not present

## 2019-05-15 DIAGNOSIS — M79672 Pain in left foot: Secondary | ICD-10-CM | POA: Diagnosis not present

## 2019-06-02 ENCOUNTER — Ambulatory Visit: Payer: Medicare HMO | Attending: Internal Medicine

## 2019-06-02 DIAGNOSIS — Z23 Encounter for immunization: Secondary | ICD-10-CM

## 2019-06-02 NOTE — Progress Notes (Signed)
   Covid-19 Vaccination Clinic  Name:  MERISA JULIO    MRN: [id number] DOB: 04/24/1945  06/02/2019  Ms. Bookwalter was observed post Covid-19 immunization for 15 minutes without incident. She was provided with Vaccine Information Sheet and instruction to access the V-Safe system.   Ms. Gonsoulin was instructed to call 911 with any severe reactions post vaccine: Marland Kitchen Difficulty breathing  . Swelling of face and throat  . A fast heartbeat  . A bad rash all over body  . Dizziness and weakness   Immunizations Administered    Name Date Dose VIS Date Route   Pfizer COVID-19 Vaccine 06/02/2019  3:15 PM 0.3 mL 03/12/2018 Intramuscular   Manufacturer: [employer], [employer]   Lot: [id number]   NDC: [address]-2

## 2019-06-23 ENCOUNTER — Ambulatory Visit: Payer: Medicare HMO | Attending: Internal Medicine

## 2019-06-23 DIAGNOSIS — Z23 Encounter for immunization: Secondary | ICD-10-CM

## 2019-06-23 NOTE — Progress Notes (Signed)
   Covid-19 Vaccination Clinic  Name:  AHONESTY WOODFIN    MRN: [id number] DOB: 1945/02/16  06/23/2019  Ms. Mcquitty was observed post Covid-19 immunization for 15 minutes without incident. She was provided with Vaccine Information Sheet and instruction to access the V-Safe system.   Ms. Uber was instructed to call 911 with any severe reactions post vaccine: Marland Kitchen Difficulty breathing  . Swelling of face and throat  . A fast heartbeat  . A bad rash all over body  . Dizziness and weakness   Immunizations Administered    Name Date Dose VIS Date Route   Pfizer COVID-19 Vaccine 06/23/2019  2:05 PM 0.3 mL 03/12/2018 Intramuscular   Manufacturer: [employer], [employer]   Lot: [id number]   NDC: [address]-1

## 2019-07-03 DIAGNOSIS — I10 Essential (primary) hypertension: Secondary | ICD-10-CM | POA: Diagnosis not present

## 2019-07-03 DIAGNOSIS — E7800 Pure hypercholesterolemia, unspecified: Secondary | ICD-10-CM | POA: Diagnosis not present

## 2019-07-03 DIAGNOSIS — I700 Atherosclerosis of aorta: Secondary | ICD-10-CM | POA: Diagnosis not present

## 2019-07-03 DIAGNOSIS — E039 Hypothyroidism, unspecified: Secondary | ICD-10-CM | POA: Diagnosis not present

## 2019-07-03 DIAGNOSIS — E1169 Type 2 diabetes mellitus with other specified complication: Secondary | ICD-10-CM | POA: Diagnosis not present

## 2019-07-16 DIAGNOSIS — M79672 Pain in left foot: Secondary | ICD-10-CM | POA: Diagnosis not present

## 2019-07-24 DIAGNOSIS — H25812 Combined forms of age-related cataract, left eye: Secondary | ICD-10-CM | POA: Diagnosis not present

## 2019-08-08 DIAGNOSIS — M79672 Pain in left foot: Secondary | ICD-10-CM | POA: Diagnosis not present

## 2019-09-22 ENCOUNTER — Other Ambulatory Visit: Payer: Self-pay

## 2019-09-22 ENCOUNTER — Emergency Department (INDEPENDENT_AMBULATORY_CARE_PROVIDER_SITE_OTHER)
Admission: EM | Admit: 2019-09-22 | Discharge: 2019-09-22 | Disposition: A | Discharge status: Home / Self Care | Payer: Medicare HMO | Source: Home / Self Care

## 2019-09-22 ENCOUNTER — Encounter: Payer: Self-pay | Admitting: Emergency Medicine

## 2019-09-22 DIAGNOSIS — W57XXXA Bitten or stung by nonvenomous insect and other nonvenomous arthropods, initial encounter: Secondary | ICD-10-CM

## 2019-09-22 DIAGNOSIS — M79672 Pain in left foot: Secondary | ICD-10-CM

## 2019-09-22 MED ORDER — DOXYCYCLINE MONOHYDRATE 50 MG PO CAPS: 100.0000 mg | ORAL_CAPSULE | Freq: Two times a day (BID) | ORAL | 0 refills | Status: AC

## 2019-09-22 NOTE — ED Triage Notes (Signed)
Patient c/o red streak/pain on left foot x 1 day, possible bug bite at a picnic yesterday, painful, swelling.  Patient took Tylenol last night before bed.

## 2019-09-22 NOTE — ED Provider Notes (Signed)
[facility]    CSN: [id number] Arrival date & time: 09/22/19  1541      History   Chief Complaint Chief Complaint  Patient presents with  . Foot Pain    HPI Debbie Mccarthy is a 74 y.o. female.   HPI  Debbie Mccarthy is a 74 y.o. female presenting to UC with c/o sudden onset Left foot pain, swelling, redness and bruising on the top of her foot last night. Pt reports eating lunch outside in the grass. She does not recall anything biting her but had a burning pain going from her foot into lower left leg yesterday.  She tried heat last night without relief.  Hx of arthritis and chronic pain in same foot for several months. She has been seeing an orthopedist for steroid injections but states this pain is different.  Hx of diabetes. Pt not sure if she has diabetic neuropathy, has not been officially diagnosed with it.    Past Medical History:  Diagnosis Date  . Diabetes (HCC)   . Hypercholesterolemia   . Hypertension   . Kidney stones     Patient Active Problem List   Diagnosis Date Noted  . Palate mass 09/05/2016  . Chronic cough 06/02/2016  . Irritable larynx syndrome 06/02/2016  . CARDIOMYOPATHY 06/19/2007  . OSTEOPENIA 06/19/2007  . DYSPNEA ON EXERTION 06/19/2007  . HYPERLIPIDEMIA 06/17/2007  . BLURRED VISION 06/17/2007  . HYPERTENSION 06/17/2007  . GERD 06/17/2007  . DYSPEPSIA 06/17/2007  . ARTHRALGIA, SHOULDER 06/17/2007  . POSTLAMINECTOMY SYNDROME THORACIC REGION 06/17/2007  . CERVICALGIA 06/17/2007  . BACK PAIN, LUMBAR, WITH RADICULOPATHY 06/17/2007  . CHEST WALL PAIN, ANTERIOR 06/17/2007    Past Surgical History:  Procedure Laterality Date  . BACK SURGERY    . NASAL SINUS SURGERY    . TUBAL LIGATION      OB History   No obstetric history on file.      Home Medications    Prior to Admission medications   Medication Sig Start Date End Date Taking? Authorizing Provider  atenolol (TENORMIN) 25 MG tablet Take by mouth daily.   Yes [provider]  glipiZIDE (GLUCOTROL XL) 10 MG 24 hr tablet Take by mouth. 09/01/19  Yes [provider]  metFORMIN (GLUCOPHAGE) 500 MG tablet Take by mouth daily with breakfast.    Yes [provider]  omeprazole (PRILOSEC) 20 MG capsule Take 20 mg by mouth daily.   Yes [provider]  pravastatin (PRAVACHOL) 20 MG tablet Take 20 mg by mouth daily.   Yes [provider]  doxycycline (MONODOX) 50 MG capsule Take 2 capsules (100 mg total) by mouth 2 (two) times daily for 7 days. 09/22/19 09/29/19  Lurene Shadow, PA-C  DULoxetine (CYMBALTA) 60 MG capsule Take 60 mg by mouth daily.    [provider]  gabapentin (NEURONTIN) 300 MG capsule 300mg  once daily x 5 days, then 300mg  twice daily x 5 days, then 300mg  three times daily to continue 08/08/16   , MD  Levothyroxine Sodium (SYNTHROID PO) Take by mouth daily.    [provider]  meclizine (ANTIVERT) 12.5 MG tablet Take 1 tablet (12.5 mg total) by mouth 3 (three) times daily as needed for dizziness. 02/04/17   08/10/16, MD    Family History Family History  Problem Relation Age of Onset  . Emphysema Father   . Asthma Father   . Stroke Father     Social History Social History  Tobacco Use  . Smoking status: Never Smoker  . Smokeless tobacco: Never Used  Substance Use Topics  . Alcohol use: Yes    Alcohol/week: 2.0 standard drinks    Types: 2 Glasses of wine per week  . Drug use: Not on file     Allergies   Morphine and related   Review of Systems Review of Systems  Musculoskeletal: Positive for arthralgias and joint swelling. Negative for myalgias.  Skin: Positive for color change. Negative for wound.     Physical Exam Triage Vital Signs ED Triage Vitals  Enc Vitals Group     BP 09/22/19 1557 (!) 195/84     Pulse Rate 09/22/19 1557 76     Resp --      Temp 09/22/19 1557 98.4 F (36.9 C)     Temp Source 09/22/19 1557 Oral     SpO2 09/22/19  1557 96 %     Weight --      Height --      Head Circumference --      Peak Flow --      Pain Score 09/22/19 1558 3     Pain Loc --      Pain Edu? --      Excl. in GC? --    No data found.  Updated Vital Signs BP (!) 195/84 (BP Location: Right Arm)   Pulse 76   Temp 98.4 F (36.9 C) (Oral)   SpO2 96%   Visual Acuity Right Eye Distance:   Left Eye Distance:   Bilateral Distance:    Right Eye Near:   Left Eye Near:    Bilateral Near:     Physical Exam Vitals and nursing note reviewed.  Constitutional:      Appearance: Normal appearance. She is well-developed.  HENT:     Head: Normocephalic and atraumatic.  Cardiovascular:     Rate and Rhythm: Normal rate and regular rhythm.     Pulses:          Dorsalis pedis pulses are 2+ on the left side.       Posterior tibial pulses are 2+ on the left side.  Pulmonary:     Effort: Pulmonary effort is normal.  Musculoskeletal:        General: Swelling and tenderness present. Normal range of motion.     Cervical back: Normal range of motion.       Feet:     Comments: Left foot: mild edema and tenderness to dorsal aspect.  Full ROM toes, ankle and knee. Calf is soft, non-tender.  Skin:    General: Skin is warm and dry.     Capillary Refill: Capillary refill takes less than 2 seconds.     Findings: Bruising and erythema present.  Neurological:     Mental Status: She is alert and oriented to person, place, and time.     Sensory: Sensory deficit (Left foot subjective decreased sensation dorsal aspect  ) present.  Psychiatric:        Behavior: Behavior normal.      UC Treatments / Results  Labs (all labs ordered are listed, but only abnormal results are displayed) Labs Reviewed - No data to display  EKG   Radiology No results found.  Procedures Procedures (including critical care time)  Medications Ordered in UC Medications - No data to display  Initial Impression / Assessment and Plan / UC Course  I have  reviewed the triage vital signs and the nursing notes.  Pertinent labs & imaging results that were available during my care of the patient were reviewed by me and considered in my medical decision making (see chart for details).     Hx and exam c/w infected insect bite to dorsal aspect of foot Rx: doxycycline.  Encouraged close f/u with PCP and podiatry for recheck later this week AVS given  Final Clinical Impressions(s) / UC Diagnoses   Final diagnoses:  Foot pain, left  Bug bite with infection, initial encounter     Discharge Instructions      Please take antibiotics as prescribed and be sure to complete entire course even if you start to feel better to ensure infection does not come back.  Call to schedule a follow up appointment with your primary care provider to discuss treatment for suspected diabetic neuropathy and call to schedule a follow up with a foot specialist for further evaluation and treatment of foot symptoms.    ED Prescriptions    Medication Sig Dispense Auth. Provider   doxycycline (MONODOX) 50 MG capsule Take 2 capsules (100 mg total) by mouth 2 (two) times daily for 7 days. 28 capsule Lurene Shadow, [place]     PDMP not reviewed this encounter.   Lurene Shadow, [place] 09/22/19 Rickey Primus

## 2019-09-22 NOTE — Discharge Instructions (Signed)
  Please take antibiotics as prescribed and be sure to complete entire course even if you start to feel better to ensure infection does not come back.  Call to schedule a follow up appointment with your primary care provider to discuss treatment for suspected diabetic neuropathy and call to schedule a follow up with a foot specialist for further evaluation and treatment of foot symptoms.

## 2020-06-07 ENCOUNTER — Other Ambulatory Visit: Payer: Self-pay | Admitting: Unknown Physician Specialty

## 2020-06-07 DIAGNOSIS — Z1231 Encounter for screening mammogram for malignant neoplasm of breast: Secondary | ICD-10-CM

## 2020-06-09 ENCOUNTER — Ambulatory Visit (INDEPENDENT_AMBULATORY_CARE_PROVIDER_SITE_OTHER): Payer: Medicare HMO

## 2020-06-09 ENCOUNTER — Other Ambulatory Visit: Payer: Self-pay

## 2020-06-09 DIAGNOSIS — Z1231 Encounter for screening mammogram for malignant neoplasm of breast: Secondary | ICD-10-CM | POA: Diagnosis not present

## 2021-06-06 ENCOUNTER — Other Ambulatory Visit: Payer: Self-pay | Admitting: Nurse Practitioner

## 2021-06-06 DIAGNOSIS — Z1231 Encounter for screening mammogram for malignant neoplasm of breast: Secondary | ICD-10-CM

## 2021-06-22 ENCOUNTER — Ambulatory Visit: Payer: Medicare HMO

## 2021-06-30 ENCOUNTER — Ambulatory Visit (INDEPENDENT_AMBULATORY_CARE_PROVIDER_SITE_OTHER): Payer: Medicare HMO

## 2021-06-30 DIAGNOSIS — Z1231 Encounter for screening mammogram for malignant neoplasm of breast: Secondary | ICD-10-CM | POA: Diagnosis not present

## 2022-02-16 ENCOUNTER — Telehealth (HOSPITAL_COMMUNITY): Payer: Self-pay | Admitting: *Deleted

## 2022-02-16 NOTE — Telephone Encounter (Signed)
Received referral notification from Dr. Adalberto Ill with [facility].  Contacted Debbie Mccarthy regarding interest in participating in Cardiac Rehab.  Debbie Mccarthy is not interested due to the distance to travel. Debbie Mccarthy does not drive. Cherre Huger, BSN Cardiac and Training and development officer

## 2022-09-14 ENCOUNTER — Other Ambulatory Visit: Payer: Self-pay | Admitting: Nurse Practitioner

## 2022-09-14 DIAGNOSIS — Z1231 Encounter for screening mammogram for malignant neoplasm of breast: Secondary | ICD-10-CM

## 2022-09-28 ENCOUNTER — Ambulatory Visit (INDEPENDENT_AMBULATORY_CARE_PROVIDER_SITE_OTHER): Payer: Medicare HMO

## 2022-09-28 DIAGNOSIS — Z1231 Encounter for screening mammogram for malignant neoplasm of breast: Secondary | ICD-10-CM

## 2023-12-03 ENCOUNTER — Other Ambulatory Visit: Payer: Self-pay | Admitting: Nurse Practitioner

## 2023-12-03 DIAGNOSIS — Z1231 Encounter for screening mammogram for malignant neoplasm of breast: Secondary | ICD-10-CM

## 2023-12-20 ENCOUNTER — Ambulatory Visit

## 2024-02-15 ENCOUNTER — Ambulatory Visit: Payer: Self-pay

## 2024-02-15 DIAGNOSIS — Z1231 Encounter for screening mammogram for malignant neoplasm of breast: Secondary | ICD-10-CM | POA: Diagnosis not present
